# Patient Record
Sex: Female | Born: 1965
Health system: Southern US, Community
[De-identification: ages and names within clinical notes are randomized; demographics above are authoritative.]

## PROBLEM LIST (undated history)

## (undated) DIAGNOSIS — E785 Hyperlipidemia, unspecified: Secondary | ICD-10-CM

## (undated) DIAGNOSIS — E119 Type 2 diabetes mellitus without complications: Secondary | ICD-10-CM

## (undated) HISTORY — PX: CHOLECYSTECTOMY: SHX55

## (undated) HISTORY — DX: Type 2 diabetes mellitus without complications: E11.9

---

## 1998-03-23 ENCOUNTER — Inpatient Hospital Stay (HOSPITAL_COMMUNITY): Admission: AD | Admit: 1998-03-23 | Discharge: 1998-03-25 | Payer: Self-pay | Admitting: Obstetrics and Gynecology

## 1998-05-01 ENCOUNTER — Emergency Department (HOSPITAL_COMMUNITY): Admission: EM | Admit: 1998-05-01 | Discharge: 1998-05-01 | Payer: Self-pay | Admitting: Emergency Medicine

## 1998-05-02 ENCOUNTER — Encounter (HOSPITAL_COMMUNITY): Admission: RE | Admit: 1998-05-02 | Discharge: 1998-07-31 | Payer: Self-pay | Admitting: *Deleted

## 1998-05-07 ENCOUNTER — Ambulatory Visit (HOSPITAL_COMMUNITY): Admission: RE | Admit: 1998-05-07 | Discharge: 1998-05-07 | Payer: Self-pay | Admitting: *Deleted

## 1998-08-09 ENCOUNTER — Encounter (HOSPITAL_COMMUNITY): Admission: RE | Admit: 1998-08-09 | Discharge: 1998-11-07 | Payer: Self-pay | Admitting: *Deleted

## 2001-07-26 ENCOUNTER — Encounter: Payer: Self-pay | Admitting: Obstetrics

## 2001-07-26 ENCOUNTER — Inpatient Hospital Stay (HOSPITAL_COMMUNITY): Admission: AD | Admit: 2001-07-26 | Discharge: 2001-07-26 | Payer: Self-pay | Admitting: Obstetrics

## 2002-02-21 ENCOUNTER — Other Ambulatory Visit: Admission: RE | Admit: 2002-02-21 | Discharge: 2002-02-21 | Payer: Self-pay | Admitting: *Deleted

## 2002-10-03 ENCOUNTER — Ambulatory Visit (HOSPITAL_BASED_OUTPATIENT_CLINIC_OR_DEPARTMENT_OTHER): Admission: RE | Admit: 2002-10-03 | Discharge: 2002-10-03 | Payer: Self-pay | Admitting: General Surgery

## 2003-03-06 ENCOUNTER — Other Ambulatory Visit: Admission: RE | Admit: 2003-03-06 | Discharge: 2003-03-06 | Payer: Self-pay | Admitting: Obstetrics and Gynecology

## 2004-08-15 ENCOUNTER — Other Ambulatory Visit: Admission: RE | Admit: 2004-08-15 | Discharge: 2004-08-15 | Payer: Self-pay | Admitting: Obstetrics and Gynecology

## 2005-09-17 ENCOUNTER — Other Ambulatory Visit: Admission: RE | Admit: 2005-09-17 | Discharge: 2005-09-17 | Payer: Self-pay | Admitting: Obstetrics and Gynecology

## 2006-08-20 ENCOUNTER — Encounter: Admission: RE | Admit: 2006-08-20 | Discharge: 2006-08-20 | Payer: Self-pay | Admitting: Orthopedic Surgery

## 2010-11-14 ENCOUNTER — Other Ambulatory Visit: Payer: Self-pay | Admitting: Obstetrics and Gynecology

## 2012-04-13 ENCOUNTER — Encounter (HOSPITAL_COMMUNITY): Payer: Self-pay | Admitting: Emergency Medicine

## 2012-04-13 ENCOUNTER — Emergency Department (HOSPITAL_COMMUNITY): Payer: 59

## 2012-04-13 ENCOUNTER — Emergency Department (HOSPITAL_COMMUNITY)
Admission: EM | Admit: 2012-04-13 | Discharge: 2012-04-13 | Disposition: A | Discharge status: Home / Self Care | Payer: 59 | Attending: Emergency Medicine | Admitting: Emergency Medicine

## 2012-04-13 DIAGNOSIS — T50995A Adverse effect of other drugs, medicaments and biological substances, initial encounter: Secondary | ICD-10-CM | POA: Insufficient documentation

## 2012-04-13 DIAGNOSIS — T483X5A Adverse effect of antitussives, initial encounter: Secondary | ICD-10-CM | POA: Insufficient documentation

## 2012-04-13 DIAGNOSIS — R4182 Altered mental status, unspecified: Secondary | ICD-10-CM | POA: Insufficient documentation

## 2012-04-13 DIAGNOSIS — T50905A Adverse effect of unspecified drugs, medicaments and biological substances, initial encounter: Secondary | ICD-10-CM

## 2012-04-13 LAB — POCT I-STAT, CHEM 8
BUN: 14 mg/dL (ref 6–23)
Calcium, Ion: 1.25 mmol/L (ref 1.12–1.32)
Chloride: 108 mEq/L (ref 96–112)
Creatinine, Ser: 0.7 mg/dL (ref 0.50–1.10)
Glucose, Bld: 106 mg/dL — ABNORMAL HIGH (ref 70–99)
HCT: 37 % (ref 36.0–46.0)
Hemoglobin: 12.6 g/dL (ref 12.0–15.0)
Potassium: 4.1 mEq/L (ref 3.5–5.1)
Sodium: 143 mEq/L (ref 135–145)
TCO2: 26 mmol/L (ref 0–100)

## 2012-04-13 MED ORDER — BENZONATATE 100 MG PO CAPS: 100.0000 mg | ORAL_CAPSULE | Freq: Three times a day (TID) | ORAL | Status: AC

## 2012-04-13 MED ORDER — BENZONATATE 100 MG PO CAPS: 100.0000 mg | ORAL_CAPSULE | Freq: Three times a day (TID) | ORAL | Status: DC

## 2012-04-13 NOTE — ED Notes (Signed)
Charge nurse discharged pt.
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
## 2012-04-13 NOTE — ED Notes (Signed)
Patient transported to X-ray 

## 2012-04-13 NOTE — ED Notes (Signed)
Pt family states pt woke up around 2am and was talking and not making sense and confused  States they have been out of town and returned Sunday  Spouse states pt could not remember names and has been very confused  Paramedics were called but pt came in by POV  Pt is alert and oriented in triage

## 2012-04-13 NOTE — ED Notes (Signed)
Pt states she feels fine and wants to go home  Encouraged to stay  Pt was able to answer all questions appropriately in triage and provide accurate medical hx

## 2012-04-13 NOTE — ED Notes (Signed)
Patient's family member has been to the desk twice asking about discharge papers. I explained to her that the nurse will be in shortly. She came back out to the desk asking again and I explained the nurse was in another patient's room. Family member asked to speak to the nursing supervisor, Stanford Breed the [employer] Nurse notified.

## 2012-07-29 ENCOUNTER — Other Ambulatory Visit: Payer: Self-pay | Admitting: Obstetrics and Gynecology

## 2013-02-09 ENCOUNTER — Other Ambulatory Visit: Payer: Self-pay | Admitting: Obstetrics and Gynecology

## 2013-09-09 ENCOUNTER — Ambulatory Visit: Payer: 59

## 2013-09-09 ENCOUNTER — Ambulatory Visit (INDEPENDENT_AMBULATORY_CARE_PROVIDER_SITE_OTHER): Payer: 59 | Admitting: Emergency Medicine

## 2013-09-09 ENCOUNTER — Telehealth: Payer: Self-pay | Admitting: Emergency Medicine

## 2013-09-09 VITALS — BP 122/76 | HR 76 | Temp 98.9°F | Resp 18 | Ht 61.0 in | Wt 208.6 lb

## 2013-09-09 DIAGNOSIS — R05 Cough: Secondary | ICD-10-CM

## 2013-09-09 DIAGNOSIS — J189 Pneumonia, unspecified organism: Secondary | ICD-10-CM

## 2013-09-09 LAB — POCT INFLUENZA A/B
Influenza A, POC: NEGATIVE
Influenza B, POC: NEGATIVE

## 2013-09-09 LAB — POCT CBC
Hemoglobin: 13 g/dL (ref 12.2–16.2)
MCH, POC: 29.7 pg (ref 27–31.2)
MPV: 8.8 fL (ref 0–99.8)
POC MID %: 8 %M (ref 0–12)
RBC: 4.37 M/uL (ref 4.04–5.48)
WBC: 4.5 10*3/uL — AB (ref 4.6–10.2)

## 2013-09-09 MED ORDER — BENZONATATE 100 MG PO CAPS: 100.0000 mg | ORAL_CAPSULE | Freq: Three times a day (TID) | ORAL | Status: DC | PRN

## 2013-09-09 MED ORDER — HYDROCODONE-HOMATROPINE 5-1.5 MG/5ML PO SYRP: 5.0000 mL | ORAL_SOLUTION | Freq: Three times a day (TID) | ORAL | Status: DC | PRN

## 2013-09-09 MED ORDER — AZITHROMYCIN 500 MG PO TABS: 500.0000 mg | ORAL_TABLET | Freq: Every day | ORAL | Status: DC

## 2013-09-09 NOTE — Patient Instructions (Signed)

## 2013-09-09 NOTE — Telephone Encounter (Signed)
Called her to advise to rest, she indicates she is nauseated, wants phenergan sent in to pharmacy please advise.

## 2013-09-09 NOTE — Progress Notes (Addendum)
This chart was scribed for Lesle Chris, MD by Joaquin Music, ED Scribe. This patient was seen in room Room/bed 13 and the patient's care was started at 8:22 AM. Subjective:    Patient ID: [name], female    DOB: [birth year], 47 y.o.   MRN: [id number] Chief Complaint  Patient presents with  . Cough    started sunday with body aches and low grade fever, and headache  . chest congestion    states she feels rattling in her chest   HPI KIMBELLA HEISLER is a 47 y.o. female who presents to the [facility] complaining of ongoing cough and chest congestion that began 6 days ago. She states she had a low grade fever 6 days ago but denies having a recurrent fever. Pt states when she got home from church 6 days ago, she began having chest congestion and a productive cough. She states she has had episodes of nausea and emesis but suspects this was due to her productive cough. Pt denies having sputum. She states she feels her "trachea constricts". Pt states she has a controled cough when she takes Delsym but states the cough worsens when she talks. Pt denies having a sore throat currently.  Pt also complains of L sided abd pain that she states "comes and goes away". She states the pain feels like it "twiches".  Pt state she has not had a menstrual cycle for years. She states she has not had a cycle over 2 years ago. Pts OB/GYN is Dr. Milton Ferguson. She does not suspect pregnancy.  Pt denies having taken a flu shot this year.  Review of Systems  Constitutional: Positive for fever.  HENT: Positive for congestion. Negative for sore throat.   Respiratory: Positive for cough.   Gastrointestinal: Positive for nausea and vomiting.  Musculoskeletal: Positive for myalgias.   Objective:   Physical Exam CONSTITUTIONAL: Well developed/well nourished HEAD: Normocephalic/atraumatic EYES: EOMI/PERRL ENMT: Mucous membranes moist there is nasal congestion the throat is only slightly red. NECK: supple no meningeal  signs SPINE:entire spine nontender CV: S1/S2 noted, no murmurs/rubs/gallops noted LUNGS: There are rhonchi noted left anterior chest ABDOMEN: soft, nontender, no rebound or guarding GU:no cva tenderness NEURO: Pt is awake/alert, moves all extremitiesx4 EXTREMITIES: pulses normal, full ROM SKIN: warm, color normal PSYCH: no abnormalities of mood noted    Results for orders placed during the hospital encounter of 04/13/12  POCT I-STAT, CHEM 8      Result Value Range   Sodium 143  135 - 145 mEq/L   Potassium 4.1  3.5 - 5.1 mEq/L   Chloride 108  96 - 112 mEq/L   BUN 14  6 - 23 mg/dL   Creatinine, Ser 6.57  0.50 - 1.10 mg/dL   Glucose, Bld 846 (*) 70 - 99 mg/dL   Calcium, Ion 9.62  9.52 - 1.32 mmol/L   TCO2 26  0 - 100 mmol/L   Hemoglobin 12.6  12.0 - 15.0 g/dL   HCT 84.1  32.4 - 40.1 %  POCT I-STAT TROPONIN I      Result Value Range   Troponin i, poc 0.00  0.00 - 0.08 ng/mL   Comment 3           [facility] reading (PRIMARY) by  Dr.Ramal Eckhardt there is a patchy infiltrate just below the right hilum. Please comment as to whether this is in the right lower lobe or right middle lobe. Results for orders placed in visit on 09/09/13  POCT CBC  Result Value Range   WBC 4.5 (*) 4.6 - 10.2 K/uL   Lymph, poc 2.2  0.6 - 3.4   POC LYMPH PERCENT 49.5  10 - 50 %L   MID (cbc) 0.4  0 - 0.9   POC MID % 8.0  0 - 12 %M   POC Granulocyte 1.9 (*) 2 - 6.9   Granulocyte percent 42.5  37 - 80 %G   RBC 4.37  4.04 - 5.48 M/uL   Hemoglobin 13.0  12.2 - 16.2 g/dL   HCT, POC 16.1  09.6 - 47.9 %   MCV 95.7  80 - 97 fL   MCH, POC 29.7  27 - 31.2 pg   MCHC 31.1 (*) 31.8 - 35.4 g/dL   RDW, POC 04.5     Platelet Count, POC 298  142 - 424 K/uL   MPV 8.8  0 - 99.8 fL  POCT INFLUENZA A/B      Result Value Range   Influenza A, POC Negative     Influenza B, POC Negative      Triage Vitals:BP 122/76  Pulse 76  Temp(Src) 98.9 F (37.2 C) (Oral)  Resp 18  Ht 5\' 1"  (1.549 m)  Wt 208 lb 9.6 oz (94.62 kg)  BMI  39.43 kg/m2  SpO2 97% Assessment & Plan:  He definitely has an infiltrate on chest x-ray. We'll treat with Zithromax Tessalon Perles Hycodan cough syrup I personally performed the services described in this documentation, which was scribed in my presence. The recorded information has been reviewed and is accurate.

## 2013-09-09 NOTE — Telephone Encounter (Signed)
Call Lashya and let her know the radiologist agrees there is probably a pneumonia in the right middle lobe

## 2013-09-10 NOTE — Telephone Encounter (Signed)
Call patient and check on status. Be sure to return to clinic if she is getting worse. If she is nauseated we can call in Zofran 8 mg ODT one every 8 hours as needed for nausea she can have #20

## 2013-09-12 MED ORDER — ONDANSETRON 8 MG PO TBDP: 8.0000 mg | ORAL_TABLET | Freq: Three times a day (TID) | ORAL | Status: DC | PRN

## 2013-09-12 NOTE — Telephone Encounter (Signed)
Left message for her to call me back. 

## 2013-09-12 NOTE — Telephone Encounter (Signed)
Sent in zofran

## 2013-09-13 NOTE — Telephone Encounter (Signed)
Patient called back and was advised.  

## 2013-09-18 ENCOUNTER — Ambulatory Visit (INDEPENDENT_AMBULATORY_CARE_PROVIDER_SITE_OTHER): Payer: 59 | Admitting: Emergency Medicine

## 2013-09-18 ENCOUNTER — Ambulatory Visit: Payer: 59

## 2013-09-18 VITALS — BP 118/70 | HR 95 | Temp 99.1°F | Resp 16 | Ht 61.0 in | Wt 211.0 lb

## 2013-09-18 DIAGNOSIS — R079 Chest pain, unspecified: Secondary | ICD-10-CM

## 2013-09-18 DIAGNOSIS — R05 Cough: Secondary | ICD-10-CM

## 2013-09-18 DIAGNOSIS — R9431 Abnormal electrocardiogram [ECG] [EKG]: Secondary | ICD-10-CM

## 2013-09-18 NOTE — Progress Notes (Signed)
   Subjective:    Patient ID: [name], female    DOB: 1966/04/15, 47 y.o.   MRN: [id number]  HPI pt here for a f/u from last visit cough, cough has improved. Pt c/o of left side under breast discomfort 3/10. It does not hurt it just feels uncomfortable this been going on since she got sick. Radiating to back. Denies fever, nausea, or vomiting. She does not have any pain on the right. She has sharp shooting left lateral chest pain. This seems to occur beneath the left breast. She states she is having no pain at the present time    Review of Systems     Objective:   Physical Exam patient is alert and cooperative she is non-ill appearing. Her neck is supple. Chest exam is clear heart regular rate without murmurs there is tenderness along the costal sternal margin second rib and also tenderness left lateral chest wall. Abdomen is soft nontender  EKG THERE are poor anterior forces. No acute changes are seen [facility] reading (PRIMARY) by  Dr. Cleta Alberts . Chest x-ray shows improvement in the right middle lobe infiltrate no rib fractures are seen       Assessment & Plan:  Her pneumonia seems to be clearing. She does have an abnormal EKG with very poor anterior forces I spoke with Dr. Jacinto Halim . and he is going to see the patient tomorrow at 11 AM. She will take a baby aspirin one a day.

## 2013-09-18 NOTE — Patient Instructions (Signed)
You have an appointment tomorrow morning at 11 AM to see Dr. Jacinto Halim.

## 2014-04-18 ENCOUNTER — Other Ambulatory Visit: Payer: Self-pay | Admitting: Obstetrics and Gynecology

## 2014-04-20 LAB — CYTOLOGY - PAP

## 2015-09-12 DIAGNOSIS — H40013 Open angle with borderline findings, low risk, bilateral: Secondary | ICD-10-CM | POA: Insufficient documentation

## 2016-07-02 ENCOUNTER — Ambulatory Visit (INDEPENDENT_AMBULATORY_CARE_PROVIDER_SITE_OTHER): Payer: 59 | Admitting: Internal Medicine

## 2016-07-02 DIAGNOSIS — Z7189 Other specified counseling: Secondary | ICD-10-CM | POA: Diagnosis not present

## 2016-07-02 DIAGNOSIS — Z789 Other specified health status: Secondary | ICD-10-CM

## 2016-07-02 DIAGNOSIS — Z23 Encounter for immunization: Secondary | ICD-10-CM

## 2016-07-02 DIAGNOSIS — IMO0002 Reserved for concepts with insufficient information to code with codable children: Secondary | ICD-10-CM

## 2016-07-02 DIAGNOSIS — Z9189 Other specified personal risk factors, not elsewhere classified: Secondary | ICD-10-CM

## 2016-07-02 DIAGNOSIS — Z0000 Encounter for general adult medical examination without abnormal findings: Secondary | ICD-10-CM

## 2016-07-02 MED ORDER — ONDANSETRON 8 MG PO TBDP
8.0000 mg | ORAL_TABLET | Freq: Three times a day (TID) | ORAL | 0 refills | Status: DC | PRN
Start: 2016-07-02 — End: 2018-08-24

## 2016-07-02 MED ORDER — AZITHROMYCIN 500 MG PO TABS: 500.0000 mg | ORAL_TABLET | Freq: Every day | ORAL | 0 refills | Status: DC

## 2016-07-02 NOTE — Progress Notes (Signed)
  RFV: pre travel counseling for [place]  Patient ID: Jeanella Flatteryonya O Klutz, female   DOB: 10/02/1966, 50 y.o.   MRN: [id number]  HPI  50yo F who is going with her husband to [place] from 9[place]23- [place] [address] [place]    Assessment and Plan   Pre travel vac = hep a, typhoid vac, tdap  Nausea= will give zofran  Traveler's diarrhea = will give rx for azithromycin. Gave precautions

## 2016-07-04 NOTE — Addendum Note (Signed)
Addended by: Andree CossHOWELL, Breklyn Fabrizio M on: 07/04/2016 11:36 AM   Modules accepted: Orders

## 2016-12-08 ENCOUNTER — Other Ambulatory Visit: Payer: Self-pay | Admitting: Family Medicine

## 2016-12-08 DIAGNOSIS — M25562 Pain in left knee: Secondary | ICD-10-CM

## 2016-12-18 ENCOUNTER — Ambulatory Visit
Admission: RE | Admit: 2016-12-18 | Discharge: 2016-12-18 | Disposition: A | Discharge status: Home / Self Care | Payer: 59 | Source: Ambulatory Visit | Attending: Family Medicine | Admitting: Family Medicine

## 2016-12-18 DIAGNOSIS — M25562 Pain in left knee: Secondary | ICD-10-CM

## 2017-05-05 ENCOUNTER — Other Ambulatory Visit: Payer: Self-pay | Admitting: Family Medicine

## 2017-05-05 DIAGNOSIS — M25562 Pain in left knee: Secondary | ICD-10-CM

## 2017-05-15 ENCOUNTER — Ambulatory Visit
Admission: RE | Admit: 2017-05-15 | Discharge: 2017-05-15 | Disposition: A | Discharge status: Home / Self Care | Payer: 59 | Source: Ambulatory Visit | Attending: Family Medicine | Admitting: Family Medicine

## 2017-05-15 DIAGNOSIS — M25562 Pain in left knee: Secondary | ICD-10-CM

## 2017-05-18 ENCOUNTER — Other Ambulatory Visit: Payer: 59

## 2017-11-18 DIAGNOSIS — M25562 Pain in left knee: Secondary | ICD-10-CM | POA: Diagnosis not present

## 2018-01-15 IMAGING — MR MR KNEE*L* W/O CM
4 of 6 series · 21 of 40 positions shown · non-contrast
Comparison: MRI left knee dated December 18, 2016.

CLINICAL DATA: Left knee pain and swelling after a fall.

EXAM:
MRI OF THE LEFT KNEE WITHOUT CONTRAST
TECHNIQUE: Multiplanar, multisequence MR imaging of the knee was performed. No
intravenous contrast was administered.

[Series 3: pd_tse_fs_tra · axial · 4.0mm · 0.42mm/px · z∈[-48,+36]mm · 3 of 24 slices shown]
[im 5/24]
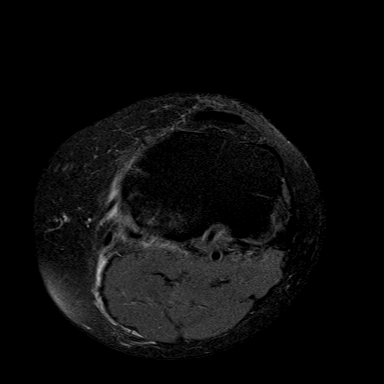
[im 14/24]
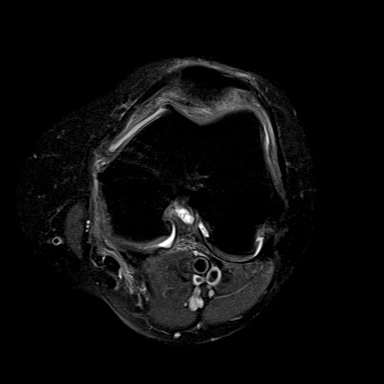
[im 24/24]
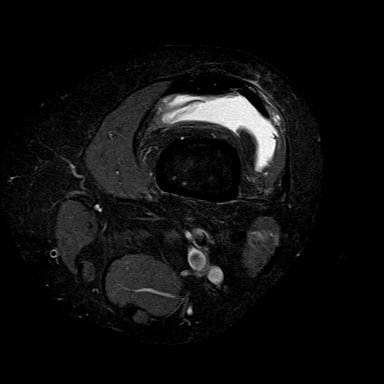

[Series 5: T2 fat-sat · coronal · 3.2mm · 0.62mm/px · 8 of 26 slices shown]
[im 1/26]
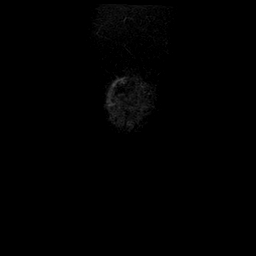
[im 4/26]
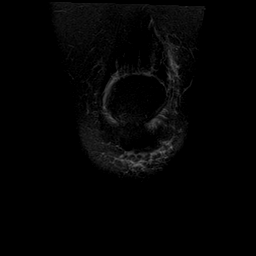
[im 8/26]
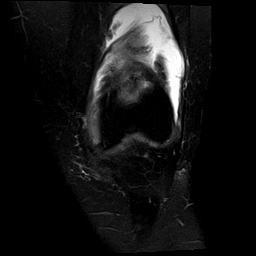
[im 11/26]
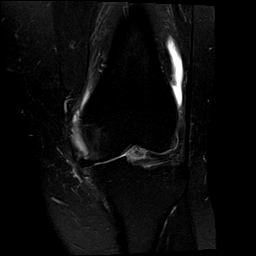
[im 15/26]
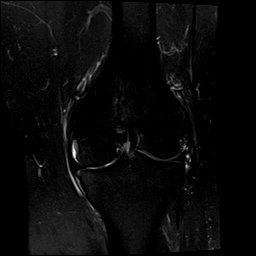
[im 18/26]
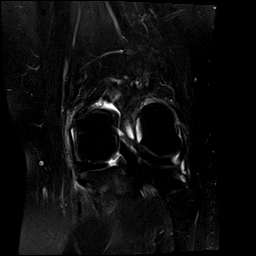
[im 22/26]
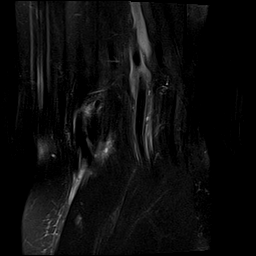
[im 26/26]
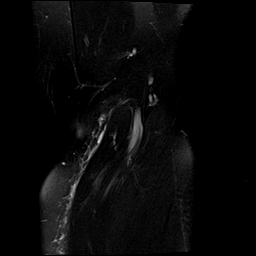

[Series 6: T1 · coronal · 3.2mm · 0.25mm/px · 3 of 26 slices shown]
[im 4/26]
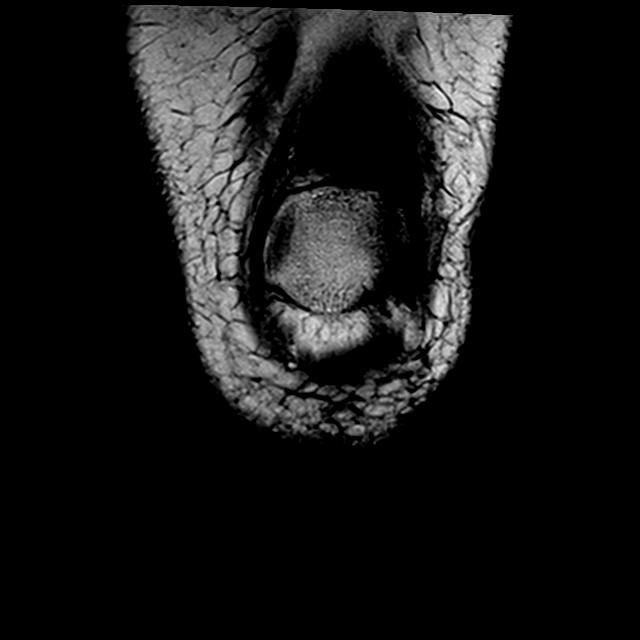
[im 15/26]
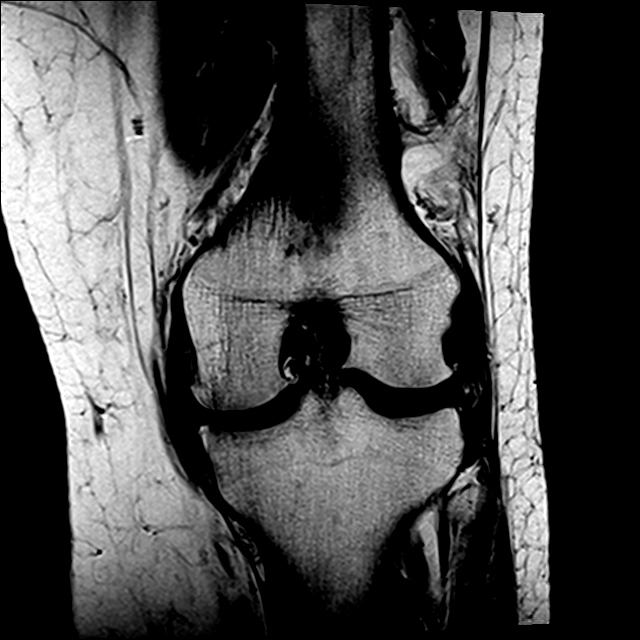
[im 22/26]
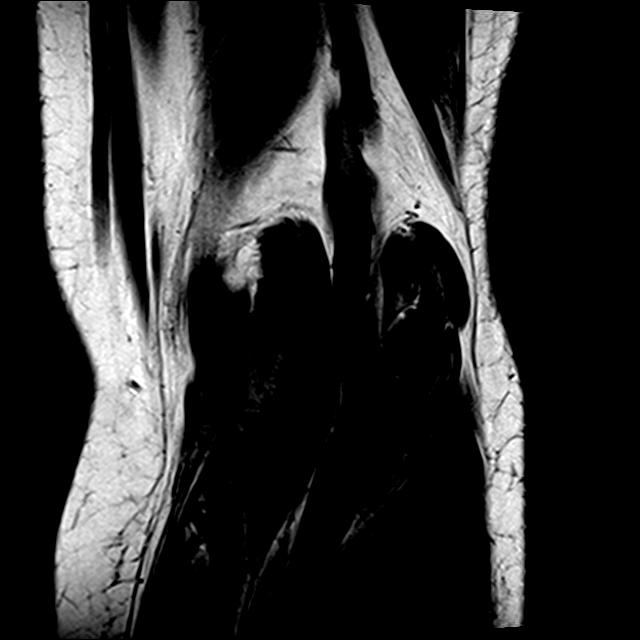

[Series 7: PD fat-sat · sagittal · 3.5mm · 0.25mm/px · 7 of 23 slices shown]
[im 1/23]
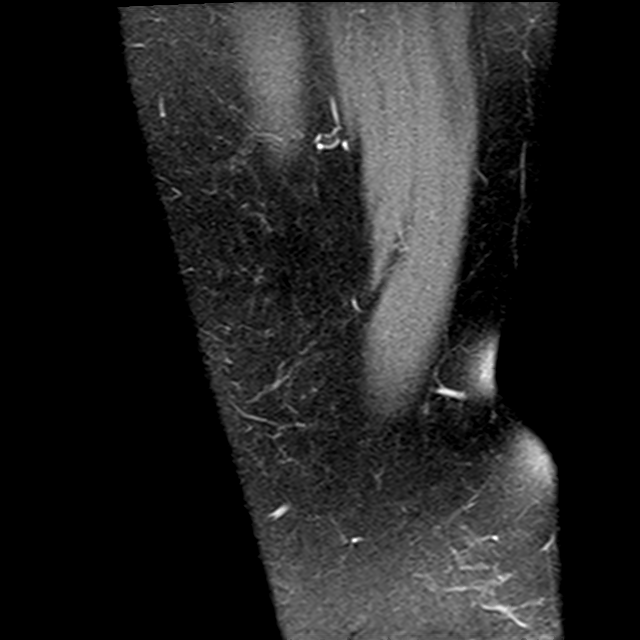
[im 4/23]
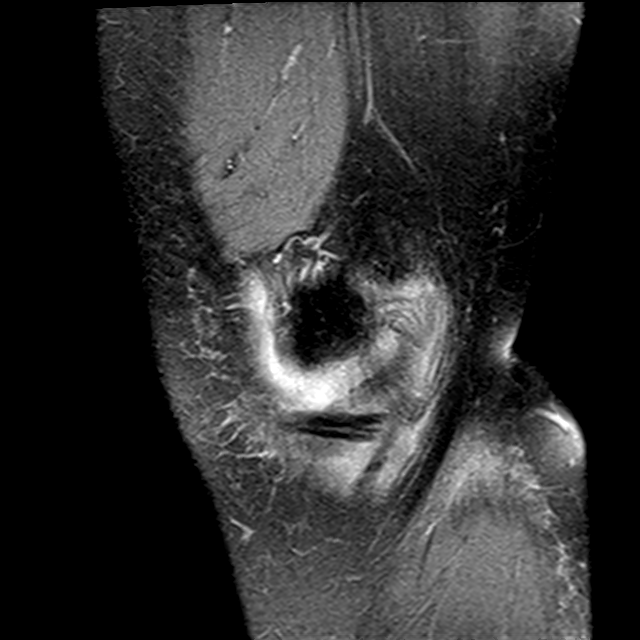
[im 8/23]
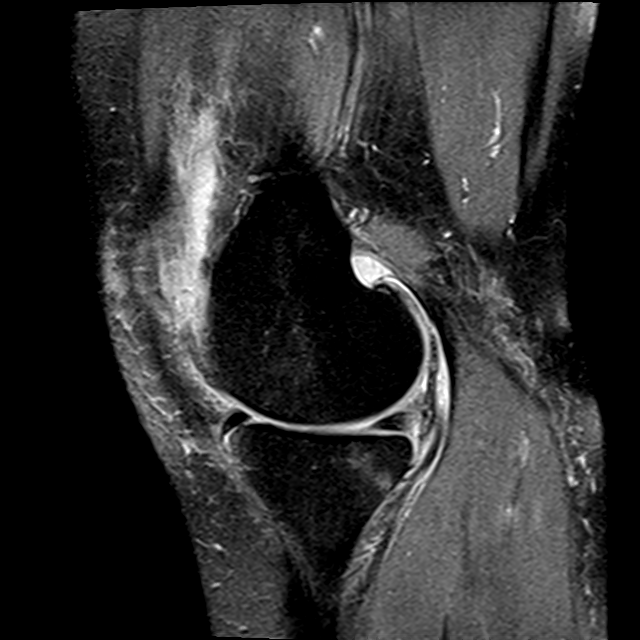
[im 12/23]
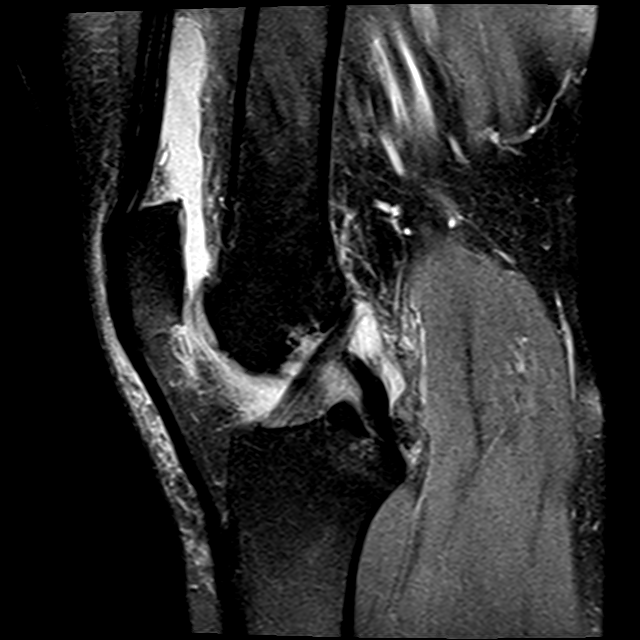
[im 15/23]
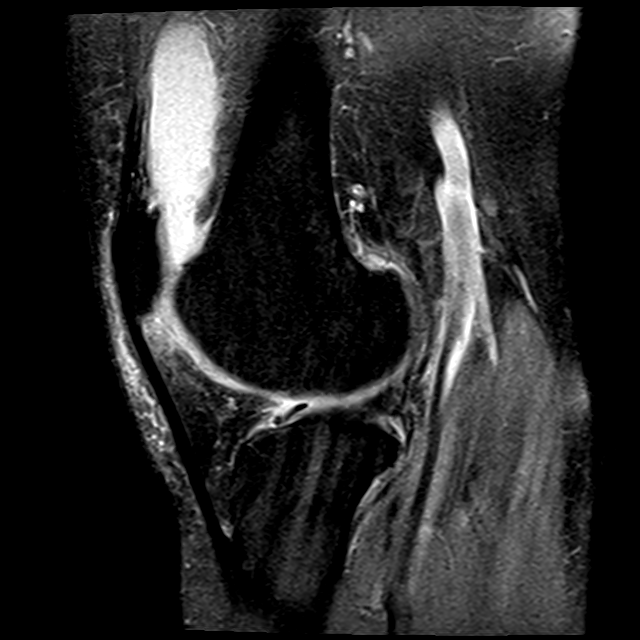
[im 19/23]
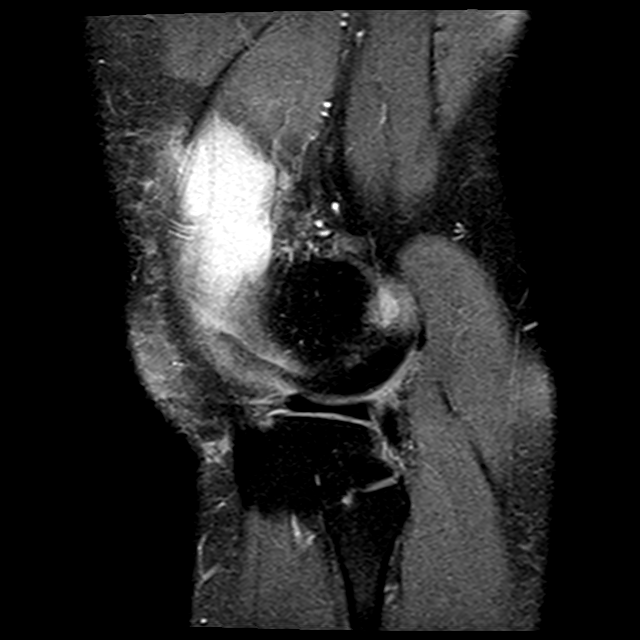
[im 23/23]
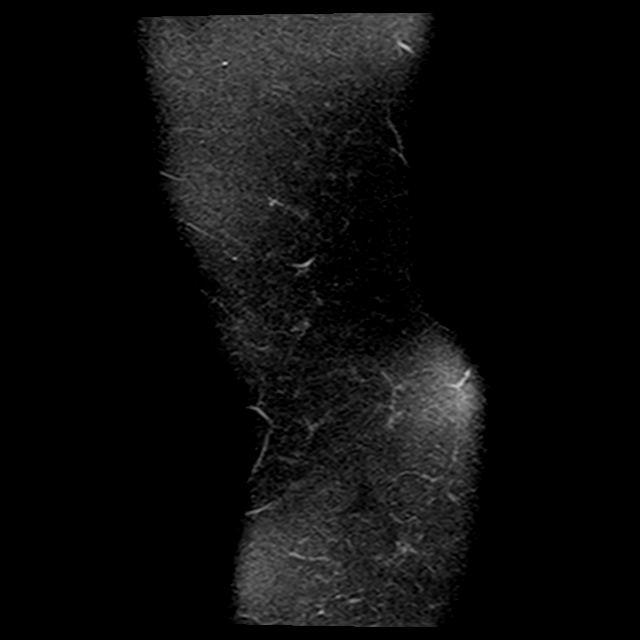

[21 of 40 positions shown; findings below may reference images not displayed]

FINDINGS: MENISCI

Medial meniscus: There is a new complete tear of the medial meniscus
posterior root resulting in extrusion of the medial meniscus body.

Lateral meniscus:  Intact.

LIGAMENTS

Cruciates:  Intact ACL and PCL.

Collaterals: Mild thickening of the proximal MCL with surrounding
edema, consistent with sprain. The lateral collateral ligament
complex is intact.

CARTILAGE

Patellofemoral: High-grade near full-thickness cartilage loss along
the medial patellar facet is unchanged. Areas of delamination and
thinning over the trochlea are also similar to prior study.

Medial: High-grade partial-thickness cartilage loss with areas of
full-thickness loss over the medial femoral condyle and medial
tibial plateau, similar to prior study.

Lateral:  Mild chondral thinning.

Joint: New moderate joint effusion. Edema in Hoffa's fat pad again
noted.

Popliteal Fossa:  Small Baker cyst.  Intact popliteus tendon.

Extensor Mechanism:  Intact quadriceps tendon and patellar tendon.

Bones: Small peripheral contusions are noted in the peripheral
medial femoral condyle and medial tibial plateau.

Other: Incidental note is made of an early origin of the anterior
tibial artery, which crosses in front of the popliteus muscle.
IMPRESSION: 1. New complete tear of the medial meniscus posterior root with
associated extrusion of the meniscal body.
2. Grade 1 sprain of the proximal MCL.
3. Tricompartmental degenerative changes and cartilage loss, worst
in the medial and patellofemoral compartments, similar to prior
study.
4. Incidental note is made of an early origin of the anterior tibial
artery, which crosses in front of the popliteus muscle.

## 2018-03-01 DIAGNOSIS — M25561 Pain in right knee: Secondary | ICD-10-CM | POA: Diagnosis not present

## 2018-03-18 ENCOUNTER — Other Ambulatory Visit: Payer: Self-pay | Admitting: Orthopaedic Surgery

## 2018-03-18 DIAGNOSIS — M25561 Pain in right knee: Principal | ICD-10-CM

## 2018-03-18 DIAGNOSIS — G8929 Other chronic pain: Secondary | ICD-10-CM

## 2018-03-19 ENCOUNTER — Ambulatory Visit
Admission: RE | Admit: 2018-03-19 | Discharge: 2018-03-19 | Disposition: A | Discharge status: Home / Self Care | Payer: 59 | Source: Ambulatory Visit | Attending: Orthopaedic Surgery | Admitting: Orthopaedic Surgery

## 2018-03-19 DIAGNOSIS — M25561 Pain in right knee: Principal | ICD-10-CM

## 2018-03-19 DIAGNOSIS — G8929 Other chronic pain: Secondary | ICD-10-CM

## 2018-03-24 DIAGNOSIS — M25561 Pain in right knee: Secondary | ICD-10-CM | POA: Diagnosis not present

## 2018-03-30 DIAGNOSIS — H40013 Open angle with borderline findings, low risk, bilateral: Secondary | ICD-10-CM | POA: Diagnosis not present

## 2018-04-01 DIAGNOSIS — G8918 Other acute postprocedural pain: Secondary | ICD-10-CM | POA: Diagnosis not present

## 2018-04-01 DIAGNOSIS — M2241 Chondromalacia patellae, right knee: Secondary | ICD-10-CM | POA: Diagnosis not present

## 2018-04-01 DIAGNOSIS — S83231A Complex tear of medial meniscus, current injury, right knee, initial encounter: Secondary | ICD-10-CM | POA: Diagnosis not present

## 2018-04-12 DIAGNOSIS — M25661 Stiffness of right knee, not elsewhere classified: Secondary | ICD-10-CM | POA: Diagnosis not present

## 2018-04-12 DIAGNOSIS — M25561 Pain in right knee: Secondary | ICD-10-CM | POA: Diagnosis not present

## 2018-04-14 DIAGNOSIS — M25561 Pain in right knee: Secondary | ICD-10-CM | POA: Diagnosis not present

## 2018-04-14 DIAGNOSIS — M25661 Stiffness of right knee, not elsewhere classified: Secondary | ICD-10-CM | POA: Diagnosis not present

## 2018-04-20 DIAGNOSIS — M25561 Pain in right knee: Secondary | ICD-10-CM | POA: Diagnosis not present

## 2018-04-20 DIAGNOSIS — M25661 Stiffness of right knee, not elsewhere classified: Secondary | ICD-10-CM | POA: Diagnosis not present

## 2018-04-23 DIAGNOSIS — M25661 Stiffness of right knee, not elsewhere classified: Secondary | ICD-10-CM | POA: Diagnosis not present

## 2018-04-23 DIAGNOSIS — M25561 Pain in right knee: Secondary | ICD-10-CM | POA: Diagnosis not present

## 2018-04-26 DIAGNOSIS — H40013 Open angle with borderline findings, low risk, bilateral: Secondary | ICD-10-CM | POA: Diagnosis not present

## 2018-07-26 DIAGNOSIS — N390 Urinary tract infection, site not specified: Secondary | ICD-10-CM | POA: Diagnosis not present

## 2018-07-26 DIAGNOSIS — Z01419 Encounter for gynecological examination (general) (routine) without abnormal findings: Secondary | ICD-10-CM | POA: Diagnosis not present

## 2018-07-28 DIAGNOSIS — Z01419 Encounter for gynecological examination (general) (routine) without abnormal findings: Secondary | ICD-10-CM | POA: Diagnosis not present

## 2018-07-28 DIAGNOSIS — Z6841 Body Mass Index (BMI) 40.0 and over, adult: Secondary | ICD-10-CM | POA: Diagnosis not present

## 2018-08-23 NOTE — Progress Notes (Signed)
Name: [name]  MRN/ DOB: [id number], 1966-07-24   Age/ Sex: 52 y.o., female    PCP: Patient, No Pcp Per   Reason for Endocrinology Evaluation: Type 2 Diabetes Mellitus     Date of Initial Endocrinology Visit: 08/24/2018     PATIENT IDENTIFIER: [name] is a 52 y.o. female with a past medical history of T2DM. The patient presented for initial ndocrinology clinic visit on 08/24/2018 for consultative assistance with her diabetes management.    HPI: [name] was referred by [facility] for management of DM    Diagnosed with DM in October with an A1c of 7.2 % Prior Medications tried: N/A Hypoglycemia episodes : 0            Hemoglobin A1c was 7.2%  Currently checking blood sugars 1 x / day,  During the day  Patient required assistance for hypoglycemia: no Patient has required hospitalization within the last 1 year from hyper or hypoglycemia: no  In terms of diet, In the past 6 months her husband hurt his hip and has been drinking regular sodas and eating poor diet.    Unable to exercise due to injury to her knees. Both parents with DM.    She is an Conservation officer, [employer].      HOME DIABETES REGIMEN: N/A  Statin: No ACE-I/ARB: No Prior Diabetic Education: Yes   METER DOWNLOAD SUMMARY: Did not bring    DIABETIC COMPLICATIONS: Microvascular complications:    Denies: neuropathy, retinopathy or nephropathy  Last eye exam: Completed 2019  Macrovascular complications:    Denies: CAD, PVD, CVA   PAST HISTORY: Past Medical History: None Past Surgical History:  Past Surgical History:  Procedure Laterality Date  . CESAREAN SECTION    . CHOLECYSTECTOMY        Social History:  reports that she has never smoked. She does not have any smokeless tobacco history on file. She reports that she does not drink alcohol or use drugs. Family History:  Family History  Problem Relation Age of Onset  . Heart disease Mother   . Hypertension Mother   . Diabetes  Father   . Hypertension Father      HOME MEDICATIONS: Allergies as of 08/24/2018   No Known Allergies     Medication List    as of 08/24/2018 11:09 AM   You have not been prescribed any medications.      ALLERGIES: No Known Allergies   REVIEW OF SYSTEMS: A comprehensive ROS was conducted with the patient and is negative except as per HPI and below:  Review of Systems  Constitutional: Negative for malaise/fatigue and weight loss.  HENT: Positive for sore throat. Negative for congestion.   Eyes: Negative for blurred vision and pain.  Respiratory: Negative for cough and shortness of breath.   Cardiovascular: Negative for chest pain and palpitations.  Gastrointestinal: Negative for diarrhea and nausea.  Genitourinary: Negative for frequency.  Musculoskeletal: Positive for joint pain.  Skin: Negative for itching and rash.  Neurological: Negative for tingling and tremors.  Endo/Heme/Allergies: Negative for polydipsia.  Psychiatric/Behavioral: Negative for depression. The patient is not nervous/anxious.       OBJECTIVE:   VITAL SIGNS: BP 132/84 (BP Location: Left Arm, Patient Position: Sitting, Cuff Size: Normal)   Pulse 80   Ht 5\' 1"  (1.549 m)   Wt 225 lb 6.4 oz (102.2 kg)   SpO2 96%   BMI 42.59 kg/m    PHYSICAL EXAM:  General: Pt appears well and  is in NAD  Hydration: Well-hydrated with moist mucous membranes and good skin turgor  HEENT: Head: Unremarkable with good dentition. Oropharynx clear without exudate.  Eyes: External eye exam normal without stare, lid lag or exophthalmos.  EOM intact.    Neck: General: Supple without adenopathy or carotid bruits. Thyroid: Thyroid size normal.  No goiter or nodules appreciated. No thyroid bruit.  Lungs: Clear with good BS bilat with no rales, rhonchi, or wheezes  Heart: RRR with normal S1 and S2 and no gallops; no murmurs; no rub  Abdomen: Normoactive bowel sounds, soft, nontender, without masses or organomegaly palpable.  Multiple stria on the abdominal wall, some of them are pale and narrow and some are wide with hyperpigmentation.  Extremities:  Lower extremities - No pretibial edema. No lesions.  Skin: Normal texture and temperature to palpation. No rash noted. Cervical  Acanthosis nigricans present .   Neuro: MS is good with appropriate affect, pt is alert and Ox3    DM foot exam:  The skin of the feet is intact without sores or ulcerations. The pedal pulses are 2+ on right and 2+ on left. The sensation is intact to a screening 5.07, 10 gram monofilament bilaterally   DATA REVIEWED: 07/29/18  A1c 7.2%  BUN/Cr. 13/ 0.64 mg/dL GFR 161 mL/min/ 0.96 m2        HDL 70 mg/dL LDL 045 mg/dL  TG 89 mg/dL  T. Chol 235 mg/dL   Old records , labs and images have been reviewed.    ASSESSMENT / PLAN / RECOMMENDATIONS:   1) Type 2 Diabetes Mellitus, Newly Diagnosed, Without complications - Most recent A1c of 7.2 %. Goal A1c < 7.0 %.     GENERAL: I have discussed with the patient the pathophysiology of diabetes. We went over the natural progression of the disease. . We stressed the importance of lifestyle changes including diet and exercise. I explained the complications associated with diabetes including retinopathy, nephropathy, neuropathy as well as increased risk of cardiovascular disease. We went over the benefit seen with glycemic control.  Patient would like to avoid starting a medication at this time. She would like to start with diet and exercise with a recheck in 3 months.  We discussed the importance of microabuminuria screen and screen for retinopathy at this time.  She was encouraged to exercise at least 150 minutes/week. Due to her joint issue, I suggested swimming exercises in an indoor pool.  We discussed the risk of CVD and neuropathy starts to  increase in the pre-diabetes period.  Discussed options for low CHO diet and avoiding sugar-sweetened  beverages.  MEDICATIONS:  N/A  EDUCATION / INSTRUCTIONS:  BG monitoring instructions: Patient is instructed to check her blood sugars 1 times a day, fasting.   2) Diabetic complications:   Eye: Does not have known diabetic retinopathy. Last eye exam was   Neuro/ Feet: Does not have known diabetic peripheral neuropathy.  Renal: Patient does not have known baseline CKD. She is not on an ACEI/ARB at present.   3) Dyslipidemia :  We discussed her elevated LDL, we discussed options of low fat diet.  Patient is not on a statin at this time . Will discuss if her LDL does not decrease to below 100 mg/dL with lifestyle changes.  I explained to her that she is at high risk for CVA and CAD events.    4) Abnormal Weight gain:   - Patient has gained ~ 40 lbs in the past 15 months, that  she attributes to poor healthy lifestyle.  - She did notice striae with weight gain but denies any weakness or easy bruising. We discussed screening for cushing but she is reluctant at this time.  - Will revisit in 3 months if no improvement in weight or glucose.    At least 45 min was spent with the patient. >50 % of the time was spent in counseling and answering questions.    F/u in 3 months.   Signed electronically by: Lyndle Herrlich, MD  [facility] Endocrinology  [facility] [address] [place], [place] [address] Phone: [phone] FAX: [phone]   CC: Patient, No Pcp Per No address on file Phone: None  Fax: None    Return to Endocrinology clinic as below: Future Appointments  Date Time Provider Department Center  11/24/2018  7:50 AM Westly Hinnant, Konrad Dolores, MD [facility] None

## 2018-08-24 ENCOUNTER — Ambulatory Visit: Payer: 59 | Admitting: Internal Medicine

## 2018-08-24 ENCOUNTER — Encounter: Payer: Self-pay | Admitting: Internal Medicine

## 2018-08-24 VITALS — BP 132/84 | HR 80 | Ht 61.0 in | Wt 225.4 lb

## 2018-08-24 DIAGNOSIS — R7309 Other abnormal glucose: Secondary | ICD-10-CM | POA: Diagnosis not present

## 2018-08-24 DIAGNOSIS — E119 Type 2 diabetes mellitus without complications: Secondary | ICD-10-CM

## 2018-08-24 DIAGNOSIS — R635 Abnormal weight gain: Secondary | ICD-10-CM | POA: Diagnosis not present

## 2018-08-24 LAB — MICROALBUMIN / CREATININE URINE RATIO
Creatinine,U: 160.9 mg/dL
Microalb Creat Ratio: 2.2 mg/g (ref 0.0–30.0)
Microalb, Ur: 3.6 mg/dL — ABNORMAL HIGH (ref 0.0–1.9)

## 2018-08-24 LAB — GLUCOSE, POCT (MANUAL RESULT ENTRY): POC GLUCOSE: 113 mg/dL — AB (ref 70–99)

## 2018-08-24 NOTE — Patient Instructions (Addendum)
-   Check sugar once a day (fasting) - Fasting sugar goal is 70-130 mg/dL - Please avoid snacking between meals and sugar-sweetened beverages - Exercise a total of 150 minutes/week

## 2018-08-25 ENCOUNTER — Encounter: Payer: Self-pay | Admitting: Internal Medicine

## 2018-08-31 ENCOUNTER — Telehealth: Payer: Self-pay | Admitting: Internal Medicine

## 2018-08-31 NOTE — Telephone Encounter (Signed)
Pt called to inquire about her blood work. She stated that she received a message on my chart with the results, but like a phone to help her understand them better.

## 2018-09-01 NOTE — Telephone Encounter (Signed)
Pt advised and stated understanding of results

## 2018-09-01 NOTE — Telephone Encounter (Signed)
Please advise 

## 2018-09-13 ENCOUNTER — Encounter: Payer: Self-pay | Admitting: Dietician

## 2018-09-13 ENCOUNTER — Encounter: Payer: 59 | Attending: Internal Medicine | Admitting: Dietician

## 2018-09-13 DIAGNOSIS — Z713 Dietary counseling and surveillance: Secondary | ICD-10-CM | POA: Diagnosis not present

## 2018-09-13 DIAGNOSIS — E119 Type 2 diabetes mellitus without complications: Secondary | ICD-10-CM

## 2018-09-13 NOTE — Progress Notes (Signed)
Diabetes Self-Management Education  Visit Type: First/Initial  Appt. Start Time: 1000 Appt. End Time: 1110  09/15/2018  [name], identified by name and date of birth, is a 52 y.o. female with a diagnosis of Diabetes: Type 2.   ASSESSMENT Patient recently diagnosed with Type 2 diabetes in 07/2018 with A1C of 7.2%.  She had been eating poorly and drinking a lot of regular soda.  Other history includes dyslipidemia with increased LDL.  Weight hx: Today's weight:  220 lbs 225 lbs at MD office 10/24/17 highest adult weight Gained 40 lbs in the past 15 months that she attributes to poor lifestyle. 185 lbs lowest adult weight in a long time. 18 months ago with good nutrition and exercise then tore her meniscus in both knees. She used to enjoy boot camp workouts before her injuries.  Patient lives with her husband.  Patient does the cooking and shopping. She is an Conservation officer, nature.  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  Husband recently injured his hip and had a hip replacement this summer.  He has gained 20 lbs since and is willing to make diet changes.  She is now walking on the treadimill and has a pool in her home she uses in the summer.  She gets 7,000-12,000 steps per day generally.  Height 5\' 2"  (1.575 m), weight 220 lb (99.8 kg). Body mass index is 40.24 kg/m.  Diabetes Self-Management Education - 09/13/18 1015      Visit Information   Visit Type  First/Initial      Initial Visit   Diabetes Type  Type 2    Are you currently following a meal plan?  Yes    What type of meal plan do you follow?  low carbohydrate, low sugar    Are you taking your medications as prescribed?  Yes    Date Diagnosed  07/2018      Health Coping   How would you rate your overall health?  Good      Psychosocial Assessment   Patient Belief/Attitude about Diabetes  Motivated to manage diabetes    Self-care barriers   None    Self-management support  Doctor's office    Other persons present  Patient    Patient Concerns  Nutrition/Meal planning;Glycemic Control;Weight Control    Special Needs  None    Preferred Learning Style  No preference indicated    Learning Readiness  Ready    How often do you need to have someone help you when you read instructions, pamphlets, or other written materials from your doctor or pharmacy?  1 - Never    What is the last grade level you completed in school?  BSN      Pre-Education Assessment   Patient understands the diabetes disease and treatment process.  Needs Instruction    Patient understands incorporating nutritional management into lifestyle.  Needs Instruction    Patient undertands incorporating physical activity into lifestyle.  Needs Instruction    Patient understands using medications safely.  Needs Instruction    Patient understands monitoring blood glucose, interpreting and using results  Needs Instruction    Patient understands prevention, detection, and treatment of acute complications.  Needs Instruction    Patient understands prevention, detection, and treatment of chronic complications.  Needs Instruction    Patient understands how to develop strategies to address psychosocial issues.  Needs Instruction    Patient understands  how to develop strategies to promote health/change behavior.  Needs Instruction      Complications   Last HgB A1C per patient/outside source  7.2 %   07/2018   How often do you check your blood sugar?  1-2 times/day    Fasting Blood glucose range (mg/dL)  16-109    Postprandial Blood glucose range (mg/dL)  604-540    Number of hypoglycemic episodes per month  0    Number of hyperglycemic episodes per week  0    Have you had a dilated eye exam in the past 12 months?  Yes    Have you had a dental exam in the past 12 months?  Yes    Are you checking your feet?  No      Dietary Intake   Breakfast  boiled eggs (2), flat bagel (1/2)  with chicken (occasional) and avocado     Snack (morning)  occasional almonds    Lunch  7 bean soup OR salmond, asparagus, salad, mashed potatoes OR salad with grilled chicken breast    Snack (afternoon)  none    Dinner  Fajitas without tortilla, 1 portion rice OR pulled pork, slaw, crab dip OR grilled chicken or pork , asparagus or other vegetable OR hotdog without a bun    Snack (evening)  rare popcorn    Beverage(s)  coffee, 2 splenda, sugar free creamer, water, sugar free orange aid, occasional coke zero or other diet soda, occasional unsweetened tea      Exercise   Exercise Type  Light (walking / raking leaves)    How many days per week to you exercise?  5    How many minutes per day do you exercise?  25    Total minutes per week of exercise  125      Patient Education   Previous Diabetes Education  No    Disease state   Definition of diabetes, type 1 and 2, and the diagnosis of diabetes;Factors that contribute to the development of diabetes;Explored patient's options for treatment of their diabetes    Nutrition management   Role of diet in the treatment of diabetes and the relationship between the three main macronutrients and blood glucose level;Food label reading, portion sizes and measuring food.;Information on hints to eating out and maintain blood glucose control.;Meal options for control of blood glucose level and chronic complications.;Effects of alcohol on blood glucose and safety factors with consumption of alcohol.    Physical activity and exercise   Role of exercise on diabetes management, blood pressure control and cardiac health.;Helped patient identify appropriate exercises in relation to his/her diabetes, diabetes complications and other health issue.    Medications  Reviewed patients medication for diabetes, action, purpose, timing of dose and side effects.    Monitoring  Purpose and frequency of SMBG.;Identified appropriate SMBG and/or A1C goals.    Acute complications   Taught treatment of hypoglycemia - the 15 rule.;Discussed and identified patients' treatment of hyperglycemia.    Chronic complications  Relationship between chronic complications and blood glucose control    Psychosocial adjustment  Role of stress on diabetes;Worked with patient to identify barriers to care and solutions;Identified and addressed patients feelings and concerns about diabetes      Individualized Goals (developed by patient)   Nutrition  General guidelines for healthy choices and portions discussed    Physical Activity  Exercise 3-5 times per week;30 minutes per day    Medications  take my medication as prescribed  Monitoring   test my blood glucose as discussed    Reducing Risk  examine blood glucose patterns      Post-Education Assessment   Patient understands the diabetes disease and treatment process.  Demonstrates understanding / competency    Patient understands incorporating nutritional management into lifestyle.  Demonstrates understanding / competency    Patient undertands incorporating physical activity into lifestyle.  Demonstrates understanding / competency    Patient understands using medications safely.  Demonstrates understanding / competency    Patient understands monitoring blood glucose, interpreting and using results  Demonstrates understanding / competency    Patient understands prevention, detection, and treatment of acute complications.  Demonstrates understanding / competency    Patient understands prevention, detection, and treatment of chronic complications.  Demonstrates understanding / competency    Patient understands how to develop strategies to address psychosocial issues.  Demonstrates understanding / competency    Patient understands how to develop strategies to promote health/change behavior.  Demonstrates understanding / competency      Outcomes   Expected Outcomes  Demonstrated interest in learning. Expect positive outcomes    Future DMSE   PRN    Program Status  Completed       Individualized Plan for Diabetes Self-Management Training:   Learning Objective:  Patient will have a greater understanding of diabetes self-management. Patient education plan is to attend individual and/or group sessions per assessed needs and concerns.   Plan:   Patient Instructions  Recommend 2000 units vitamin D daily. Mindfulness:  Choices  Eating slowly  Stopping when satisfied Continue to stay active.  Aim for 150 minutes spread out throughout the week. Continue your great nutrition changes.     Expected Outcomes:  Demonstrated interest in learning. Expect positive outcomes  Education material provided: ADA Diabetes: Your Take Control Guide, Food label handouts, Meal plan card and Snack sheet, eating out  If problems or questions, patient to contact team via:  Phone  Future DSME appointment: PRN

## 2018-09-13 NOTE — Patient Instructions (Addendum)
Recommend 2000 units vitamin D daily. Mindfulness:  Choices  Eating slowly  Stopping when satisfied Continue to stay active.  Aim for 150 minutes spread out throughout the week. Continue your great nutrition changes.

## 2018-11-23 DIAGNOSIS — H40013 Open angle with borderline findings, low risk, bilateral: Secondary | ICD-10-CM | POA: Diagnosis not present

## 2018-11-24 ENCOUNTER — Encounter: Payer: Self-pay | Admitting: Internal Medicine

## 2018-11-24 ENCOUNTER — Ambulatory Visit: Payer: 59 | Admitting: Internal Medicine

## 2018-11-24 VITALS — BP 122/84 | HR 75 | Ht 61.0 in | Wt 205.4 lb

## 2018-11-24 DIAGNOSIS — E119 Type 2 diabetes mellitus without complications: Secondary | ICD-10-CM | POA: Diagnosis not present

## 2018-11-24 LAB — POCT GLYCOSYLATED HEMOGLOBIN (HGB A1C): HEMOGLOBIN A1C: 5.8 % — AB (ref 4.0–5.6)

## 2018-11-24 LAB — GLUCOSE, POCT (MANUAL RESULT ENTRY): POC Glucose: 125 mg/dl — AB (ref 70–99)

## 2018-11-24 NOTE — Progress Notes (Signed)
Name: [name]  Age/ Sex: 53 y.o., female   MRN/ DOB: [id number], 05/02/1966     PCP: Patient, No Pcp Per   Reason for Endocrinology Evaluation: Type 2 Diabetes Mellitus  Initial Endocrine Consultative Visit: 08/24/18    PATIENT IDENTIFIER: [name] is a 53 y.o. female with a past medical history of T2DM. The patient has followed with Endocrinology clinic since 08/24/18 for consultative assistance with management of her diabetes.  DIABETIC HISTORY:  Ms. [name] was diagnosed with T2DM in October, 2019 with an A1c of 7.2% during routine Gyn appointment.Patient opted for lifestyle changes for management rather start on any anti glycemic agents.. Her hemoglobin A1c was 7.2 % in 07/2018  SUBJECTIVE:    Today (11/24/2018): Ms. [name] is here for a 3 month follow up on her diabetes management. She checks her blood sugars 1 times daily. The patient has not had hypoglycemic episodes since the last clinic visit. Otherwise, the patient has not required any recent emergency interventions for hypoglycemia and has not had recent hospitalizations secondary to hyper or hypoglycemic episodes.   In the past 3 weeks risk she has been going through some stress related to her father who has glaucoma and has worsening kidney function, her daughter who is 466 years old also has been diagnosed with diabetes and currently is on metformin, but apparently daughter is intolerant to metformin.    ROS: As per HPI and as detailed below: Review of Systems  Constitutional: Positive for weight loss. Negative for malaise/fatigue.  Respiratory: Negative for cough and shortness of breath.   Cardiovascular: Negative for chest pain and palpitations.  Gastrointestinal: Negative for diarrhea and nausea.      HOME DIABETES REGIMEN:  N/A    METER DOWNLOAD SUMMARY: Did not bring     DIABETIC COMPLICATIONS: Microvascular  complications:   Denies: neuropathy, retinopathy or nephropathy  Last eye exam: Completed 11/2018  Macrovascular complications:    Denies: CAD, PVD, CVA   HISTORY:  Past Medical History:  Past Medical History:  Diagnosis Date  . Diabetes mellitus without complication [facility](HCC)     Past Surgical History:  Past Surgical History:  Procedure Laterality Date  . CESAREAN SECTION    . CHOLECYSTECTOMY       Social History:  reports that she has never smoked. She has never used smokeless tobacco. She reports that she does not drink alcohol or use drugs. Family History:  Family History  Problem Relation Age of Onset  . Heart disease Mother   . Hypertension Mother   . Diabetes Father   . Hypertension Father       HOME MEDICATIONS: Allergies as of 11/24/2018   No Known Allergies     Medication List    as of November 24, 2018  7:34 AM   You have not been prescribed any medications.      OBJECTIVE:   Vital Signs: There were no vitals taken for this visit.  Wt Readings from Last 3 Encounters:  09/13/18 220 lb (99.8 kg)  08/24/18 225 lb 6.4 oz (102.2 kg)  09/18/13 211 lb (95.7 kg)     Exam: General: Pt appears well and is in NAD  Lungs: Clear with good BS bilat with no rales, rhonchi, or wheezes  Heart: RRR with normal S1 and S2 and no gallops; no murmurs; no rub  Abdomen: Normoactive bowel sounds, soft, nontender, without masses or organomegaly palpable  Extremities: No pretibial edema. No tremor. Normal strength and motion throughout.  See detailed diabetic foot exam below.  Skin: Normal texture and temperature to palpation.  Neuro: MS is good with appropriate affect, pt is alert and Ox3    DM foot exam:08/24/2018   The skin of the feet is intact without sores or ulcerations. The pedal pulses are 2+ on right and 2+ on left. The sensation is intact to a screening 5.07, 10 gram monofilament bilaterally         DATA REVIEWED:  No results found for:  HGBA1C Lab Results  Component Value Date   MICROALBUR 3.6 (H) 08/24/2018   CREATININE 0.70 04/13/2012   Lab Results  Component Value Date   MICRALBCREAT 2.2 08/24/2018    LDL 144 mg/dL   ASSESSMENT / PLAN / RECOMMENDATIONS:   1) Type 2 Diabetes Mellitus, Newly Diagnosed, Without complications - Most recent A1c of 5.8 %. Goal A1c < 7.0 %.    Plan:  -Placed the patient on lifestyle changes and weight loss, she has lost 20 pounds in the past 3 months, I have encouraged her to continue with lifestyle changes.  -We will continue with lifestyle changes no medications needed at this point.   EDUCATION / INSTRUCTIONS:  BG monitoring instructions: Patient is instructed to check her blood sugars 1 times a day, fasting.  Call [place] [facility] if: BG persistently < 70 or > 300. . I reviewed the Rule of 15 for the treatment of hypoglycemia in detail with the patient. Literature supplied.    2) Diabetic complications:   Eye: Does not have known diabetic retinopathy.   Neuro/ Feet: Does not have known diabetic peripheral neuropathy .   Renal: Patient does not have known baseline CKD.   3) Lipids: Patient is not on a statin.  Her last LDL was above goal at 144 mg/dL, we will check on next visit.     F/U in 3 months   Signed electronically by: Lyndle Herrlich, MD  [facility]  [facility] [address] [place], [place] [address] Phone: [phone] FAX: [phone]   CC: Patient, No Pcp Per No address on file Phone: None  Fax: None  Return to Endocrinology clinic as below: Future Appointments  Date Time Provider Department Center  11/24/2018  7:50 AM Gudelia Eugene, Konrad Dolores, MD [facility] None

## 2018-11-24 NOTE — Patient Instructions (Signed)
You have done amazing job with lifestyle changes, keep up the good work

## 2019-02-21 DIAGNOSIS — H40013 Open angle with borderline findings, low risk, bilateral: Secondary | ICD-10-CM | POA: Diagnosis not present

## 2019-03-23 NOTE — Progress Notes (Signed)
atorvat[facility]   Name: [name]  Age/ Sex: 53 y.o., female   MRN/ DOB: [id number], 10/29/1965     PCP: Patient, No Pcp Per   Reason for Endocrinology Evaluation: Type 2 Diabetes Mellitus  Initial Endocrine Consultative Visit: 08/24/18    PATIENT IDENTIFIER: [name] is a 53 y.o. female with a past medical history of T2DM. The patient has followed with Endocrinology clinic since 08/24/18 for consultative assistance with management of her diabetes.  DIABETIC HISTORY:  [name] was diagnosed with T2DM in October, 2019 with an A1c of 7.2% during routine Gyn appointment.Patient opted for lifestyle changes for management rather than start on any anti glycemic agents.. Her hemoglobin A1c was 7.2 % in 07/2018  She is an occupational nurse  SUBJECTIVE:    Previous Visit (11/24/2018): A1c was 5.8% with lifestyle changes.   Today (03/25/2019): [name] is here for a 4 month follow up on her diabetes management. She checks her blood sugars 1-2  times daily. The patient has not had hypoglycemic episodes since the last clinic visit. Otherwise, the patient has not required any recent emergency interventions for hypoglycemia and has not had recent hospitalizations secondary to hyper or hypoglycemic episodes.   She did cut her right ring finger yesterday, presented to the ED    ROS: As per HPI and as detailed below: Review of Systems  Constitutional: Negative for malaise/fatigue.  Respiratory: Negative for cough and shortness of breath.   Cardiovascular: Negative for chest pain and palpitations.  Gastrointestinal: Negative for diarrhea and nausea.      HOME DIABETES REGIMEN:  N/A    METER DOWNLOAD SUMMARY: 4/30-5/29/2020 Fingerstick Blood Glucose Tests = 5 Overall Mean FS Glucose = 122.4 Standard Deviation = 18.3  BG Ranges: Low = 110 High = 153   Hypoglycemic Events/30 Days: BG < 50 = 0 Episodes of symptomatic severe hypoglycemia = 0     DIABETIC COMPLICATIONS:  Microvascular complications:   Denies: neuropathy, retinopathy or nephropathy  Last eye exam: Completed 11/2018  Macrovascular complications:    Denies: CAD, PVD, CVA   HISTORY:  Past Medical History:  Past Medical History:  Diagnosis Date  . Diabetes mellitus without complication [facility](HCC)    Past Surgical History:  Past Surgical History:  Procedure Laterality Date  . CESAREAN SECTION    . CHOLECYSTECTOMY      Social History:  reports that she has never smoked. She has never used smokeless tobacco. She reports that she does not drink alcohol or use drugs. Family History:  Family History  Problem Relation Age of Onset  . Heart disease Mother   . Hypertension Mother   . Diabetes Father   . Hypertension Father      HOME MEDICATIONS: Allergies as of 03/24/2019   No Known Allergies     Medication List       Accurate as of March 24, 2019 11:59 PM. If you have any questions, ask your nurse or doctor.        cephALEXin 500 MG capsule Commonly known as:  KEFLEX        OBJECTIVE:   Vital Signs: BP 118/64 (BP Location: Left Arm, Patient Position: Sitting, Cuff Size: Large)   Pulse 62   Temp 97.9 F (36.6 C)   Ht 5\' 1"  (1.549 m)   Wt 206 lb (93.4 kg)   SpO2 97%   BMI 38.92 kg/m   Wt Readings from Last 3 Encounters:  03/24/19 206 lb (93.4 kg)  11/24/18  205 lb 6.4 oz (93.2 kg)  09/13/18 220 lb (99.8 kg)     Exam: General: Pt appears well and is in NAD  Lungs: Clear with good BS bilat with no rales, rhonchi, or wheezes  Heart: RRR with normal S1 and S2 and no gallops; no murmurs; no rub  Abdomen: Normoactive bowel sounds, soft, nontender, without masses or organomegaly palpable  Extremities: No pretibial edema. No tremor.  Skin: Normal texture and temperature to palpation.  Neuro: MS is good with appropriate affect, pt is alert and Ox3    DM foot exam:03/24/2019 The skin of the feet is intact without sores or ulcerations. The pedal pulses are 2+ on right  and 2+ on left. The sensation is intact to a screening 5.07, 10 gram monofilament bilaterally       DATA REVIEWED:  Lab Results  Component Value Date   HGBA1C 5.9 (A) 03/24/2019   HGBA1C 5.8 (A) 11/24/2018   Lab Results  Component Value Date   MICROALBUR 3.6 (H) 08/24/2018   LDLCALC 154 (H) 03/24/2019   CREATININE 0.69 03/24/2019   Lab Results  Component Value Date   MICRALBCREAT 2.2 08/24/2018    LDL 144 mg/dL (47/0962)   [name], [name] (MRN [id number]) as of 03/25/2019 13:05  Ref. Range 03/24/2019 08:52  Sodium Latest Ref Range: 135 - 145 mEq/L 141  Potassium Latest Ref Range: 3.5 - 5.1 mEq/L 3.9  Chloride Latest Ref Range: 96 - 112 mEq/L 106  CO2 Latest Ref Range: 19 - 32 mEq/L 24  Glucose Latest Ref Range: 70 - 99 mg/dL 546 (H)  BUN Latest Ref Range: 6 - 23 mg/dL 14  Creatinine Latest Ref Range: 0.40 - 1.20 mg/dL 5.03  Calcium Latest Ref Range: 8.4 - 10.5 mg/dL 9.6  GFR Latest Ref Range: >60.00 mL/min 107.69  Total CHOL/HDL Ratio Unknown 3  Cholesterol Latest Ref Range: 0 - 200 mg/dL 546 (H)  HDL Cholesterol Latest Ref Range: >39.00 mg/dL 56.81  LDL (calc) Latest Ref Range: 0 - 99 mg/dL 275 (H)  NonHDL Unknown 169.20  Triglycerides Latest Ref Range: 0.0 - 149.0 mg/dL 17.0  VLDL Latest Ref Range: 0.0 - 40.0 mg/dL 01.7      ASSESSMENT / PLAN / RECOMMENDATIONS:   1) Type 2 Diabetes Mellitus, excellent control, Without complications - Most recent A1c of 5.9 %. Goal A1c < 7.0 %.    Plan:  -Praised the patient on lifestyle changes, encouraged to continue with current changes and glucose checks.  - She was advised to check glucose fasting and bedtime    EDUCATION / INSTRUCTIONS:  BG monitoring instructions: Patient is instructed to check her blood sugars 1-2 times a day, fasting and bedtime.  Call [place] [facility] clinic if: BG persistently < 70 or > 300. . I reviewed the Rule of 15 for the treatment of hypoglycemia in detail with the  patient. Literature supplied.    2) Diabetic complications:   Eye: Does not have known diabetic retinopathy.   Neuro/ Feet: Does not have known diabetic peripheral neuropathy .   Renal: Patient does not have known baseline CKD.   3) Lipids: Patient is not on a statin.  Repeat  LDL continues to be high  494 mg/dL. Discussed ADA recommendation and the cardiovascular benefits for statins. Pt agreed to starting lipitor. Discussed side effects of myalgia .     F/U in 4 months   Signed electronically by: Lyndle Herrlich, MD  [place] [facility]  [facility]  [address] [place], [place] [address] Phone: [phone] FAX: [phone]   CC: Patient, No Pcp Per No address on file Phone: None  Fax: None  Return to Endocrinology clinic as below: Future Appointments  Date Time Provider Department Center  07/25/2019  8:10 AM Shamleffer, Konrad Dolores, MD [facility] None

## 2019-03-24 ENCOUNTER — Encounter: Payer: Self-pay | Admitting: Internal Medicine

## 2019-03-24 ENCOUNTER — Other Ambulatory Visit: Payer: Self-pay

## 2019-03-24 ENCOUNTER — Ambulatory Visit: Payer: 59 | Admitting: Internal Medicine

## 2019-03-24 VITALS — BP 118/64 | HR 62 | Temp 97.9°F | Ht 61.0 in | Wt 206.0 lb

## 2019-03-24 DIAGNOSIS — E119 Type 2 diabetes mellitus without complications: Secondary | ICD-10-CM | POA: Diagnosis not present

## 2019-03-24 DIAGNOSIS — E785 Hyperlipidemia, unspecified: Secondary | ICD-10-CM | POA: Diagnosis not present

## 2019-03-24 LAB — BASIC METABOLIC PANEL
BUN: 14 mg/dL (ref 6–23)
CO2: 24 mEq/L (ref 19–32)
Calcium: 9.6 mg/dL (ref 8.4–10.5)
Chloride: 106 mEq/L (ref 96–112)
Creatinine, Ser: 0.69 mg/dL (ref 0.40–1.20)
GFR: 107.69 mL/min (ref >60.00)
Glucose, Bld: 108 mg/dL — ABNORMAL HIGH (ref 70–99)
Potassium: 3.9 mEq/L (ref 3.5–5.1)
Sodium: 141 mEq/L (ref 135–145)

## 2019-03-24 LAB — POCT GLYCOSYLATED HEMOGLOBIN (HGB A1C): Hemoglobin A1C: 5.9 % — AB (ref 4.0–5.6)

## 2019-03-24 LAB — LIPID PANEL
Cholesterol: 238 mg/dL — ABNORMAL HIGH (ref 0–200)
HDL: 69.2 mg/dL (ref >39.00)
LDL Cholesterol: 154 mg/dL — ABNORMAL HIGH (ref 0–99)
NonHDL: 169.2
Total CHOL/HDL Ratio: 3
Triglycerides: 75 mg/dL (ref 0.0–149.0)
VLDL: 15 mg/dL (ref 0.0–40.0)

## 2019-03-24 NOTE — Patient Instructions (Signed)
-   Keep up the good work, your sugar readings have been optimal.

## 2019-03-25 MED ORDER — ATORVASTATIN CALCIUM 20 MG PO TABS: 20.0000 mg | ORAL_TABLET | Freq: Every day | ORAL | 3 refills | Status: DC

## 2019-07-25 ENCOUNTER — Ambulatory Visit: Payer: 59 | Admitting: Internal Medicine

## 2019-08-03 ENCOUNTER — Ambulatory Visit: Payer: 59 | Admitting: Internal Medicine

## 2019-08-17 ENCOUNTER — Telehealth: Payer: Self-pay

## 2019-08-23 ENCOUNTER — Other Ambulatory Visit: Payer: Self-pay

## 2019-08-25 ENCOUNTER — Ambulatory Visit: Payer: 59 | Admitting: Internal Medicine

## 2019-08-25 ENCOUNTER — Other Ambulatory Visit: Payer: Self-pay

## 2019-08-25 ENCOUNTER — Encounter: Payer: Self-pay | Admitting: Internal Medicine

## 2019-08-25 VITALS — BP 124/78 | HR 70 | Temp 97.9°F | Ht 61.0 in | Wt 205.0 lb

## 2019-08-25 DIAGNOSIS — E785 Hyperlipidemia, unspecified: Secondary | ICD-10-CM | POA: Diagnosis not present

## 2019-08-25 DIAGNOSIS — E119 Type 2 diabetes mellitus without complications: Secondary | ICD-10-CM

## 2019-08-25 LAB — LIPID PANEL
Cholesterol: 203 mg/dL — ABNORMAL HIGH (ref 0–200)
HDL: 65.9 mg/dL (ref >39.00)
LDL Cholesterol: 123 mg/dL — ABNORMAL HIGH (ref 0–99)
NonHDL: 137.51
Total CHOL/HDL Ratio: 3
Triglycerides: 74 mg/dL (ref 0.0–149.0)
VLDL: 14.8 mg/dL (ref 0.0–40.0)

## 2019-08-25 LAB — COMPREHENSIVE METABOLIC PANEL
ALT: 30 U/L (ref 0–35)
AST: 21 U/L (ref 0–37)
Albumin: 4.6 g/dL (ref 3.5–5.2)
Alkaline Phosphatase: 72 U/L (ref 39–117)
BUN: 17 mg/dL (ref 6–23)
CO2: 28 mEq/L (ref 19–32)
Calcium: 9.8 mg/dL (ref 8.4–10.5)
Chloride: 105 mEq/L (ref 96–112)
Creatinine, Ser: 0.66 mg/dL (ref 0.40–1.20)
GFR: 113.18 mL/min (ref >60.00)
Glucose, Bld: 102 mg/dL — ABNORMAL HIGH (ref 70–99)
Potassium: 3.8 mEq/L (ref 3.5–5.1)
Sodium: 140 mEq/L (ref 135–145)
Total Bilirubin: 0.5 mg/dL (ref 0.2–1.2)
Total Protein: 7.7 g/dL (ref 6.0–8.3)

## 2019-08-25 LAB — MICROALBUMIN / CREATININE URINE RATIO
Creatinine,U: 145.6 mg/dL
Microalb Creat Ratio: 0.5 mg/g (ref 0.0–30.0)
Microalb, Ur: 0.7 mg/dL (ref 0.0–1.9)

## 2019-08-25 LAB — POCT GLYCOSYLATED HEMOGLOBIN (HGB A1C): Hemoglobin A1C: 6 % — AB (ref 4.0–5.6)

## 2019-08-25 NOTE — Patient Instructions (Signed)
-   Keep up the good work , you continue to do GREAT - Stop by the lab today

## 2019-08-25 NOTE — Progress Notes (Signed)
Name: [name]  Age/ Sex: 53 y.[name], female   MRN/ DOB: [id number], 1965-12-05     PCP: Amanda Ishihara, MD   Reason for Endocrinology Evaluation: Type 2 Diabetes Mellitus  Initial Endocrine Consultative Visit: 08/24/18    PATIENT IDENTIFIER: [name] is a 53 y.[name] female with a past medical history of T2DM. The patient has followed with Endocrinology clinic since 08/24/18 for consultative assistance with management of her diabetes.  DIABETIC HISTORY:  [name] was diagnosed with T2DM in October, 2019 with an A1c of 7.2% during routine Gyn appointment.Patient opted for lifestyle changes for management rather than start on any anti glycemic agents.. Her hemoglobin A1c was 7.2 % in 07/2018  She is an occupational nurse  SUBJECTIVE:    Previous Visit (03/24/2019):  A1c was 5.9 % with lifestyle changes. Lipitor started   Today (08/26/2019): [name] is here for a 4 month follow up on her diabetes management. She checks her blood sugars 1-2  times daily. The patient has not had hypoglycemic episodes since the last clinic visit. Otherwise, the patient has not required any recent emergency interventions for hypoglycemia and has not had recent hospitalizations secondary to hyper or hypoglycemic episodes.   Father with glaucoma with ESRD but not on dialysis and his been busy caring for him  She has been having issues with back pain and sciatica, she also has occasional knee pains.    ROS: As per HPI and as detailed below: Review of Systems  Constitutional: Negative for malaise/fatigue.  Respiratory: Negative for cough and shortness of breath.   Cardiovascular: Negative for chest pain and palpitations.  Gastrointestinal: Negative for diarrhea and nausea.      HOME  REGIMEN:  Lipitor 20 mg daily     METER DOWNLOAD SUMMARY: 10/23-11/02/2019 Fingerstick Blood Glucose Tests = 3 Overall Mean FS Glucose = 124  BG Ranges: Low = 109 High = 131   Hypoglycemic  Events/30 Days: BG < 50 = 0 Episodes of symptomatic severe hypoglycemia = 0     DIABETIC COMPLICATIONS: Microvascular complications:   Denies: neuropathy, retinopathy or nephropathy  Last eye exam: Completed 11/2018  Macrovascular complications:    Denies: CAD, PVD, CVA   HISTORY:  Past Medical History:  Past Medical History:  Diagnosis Date  . Diabetes mellitus without complication [facility])    Past Surgical History:  Past Surgical History:  Procedure Laterality Date  . CESAREAN SECTION    . CHOLECYSTECTOMY      Social History:  reports that she has never smoked. She has never used smokeless tobacco. She reports that she does not drink alcohol or use drugs. Family History:  Family History  Problem Relation Age of Onset  . Heart disease Mother   . Hypertension Mother   . Diabetes Father   . Hypertension Father      HOME MEDICATIONS: Allergies as of 08/25/2019   No Known Allergies     Medication List       Accurate as of August 25, 2019 11:59 PM. If you have any questions, ask your nurse or doctor.        STOP taking these medications   cephALEXin 500 MG capsule Commonly known as: KEFLEX Stopped by: Scarlette Shorts, MD     TAKE these medications   atorvastatin 20 MG tablet Commonly known as: LIPITOR Take 1 tablet (20 mg total) by mouth at bedtime.        OBJECTIVE:   Vital Signs: BP 124/78 (  BP Location: Left Arm, Patient Position: Sitting, Cuff Size: Large)   Pulse 70   Temp 97.9 F (36.6 C) (Oral)   Ht 5\' 1"  (1.549 m)   Wt 205 lb (93 kg)   SpO2 98%   BMI 38.73 kg/m   Wt Readings from Last 3 Encounters:  08/25/19 205 lb (93 kg)  03/24/19 206 lb (93.4 kg)  11/24/18 205 lb 6.4 oz (93.2 kg)     Exam: General: Pt appears well and is in NAD  Lungs: Clear with good BS bilat with no rales, rhonchi, or wheezes  Heart: RRR with normal S1 and S2 and no gallops; no murmurs; no rub  Abdomen: Normoactive bowel sounds, soft, nontender,  without masses or organomegaly palpable  Extremities: No pretibial edema. No tremor.  Skin: Normal texture and temperature to palpation.  Neuro: MS is good with appropriate affect, pt is alert and Ox3    DM foot exam: 03/24/2019 The skin of the feet is intact without sores or ulcerations. The pedal pulses are 2+ on right and 2+ on left. The sensation is intact to a screening 5.07, 10 gram monofilament bilaterally       DATA REVIEWED:  Lab Results  Component Value Date   HGBA1C 6.0 (A) 08/25/2019   HGBA1C 5.9 (A) 03/24/2019   HGBA1C 5.8 (A) 11/24/2018   Lab Results  Component Value Date   MICROALBUR 0.7 08/25/2019   LDLCALC 123 (H) 08/25/2019   CREATININE 0.66 08/25/2019   Lab Results  Component Value Date   MICRALBCREAT 0.5 08/25/2019     [name], [name] (MRN [id number]) as of 08/26/2019 07:50  Ref. Range 08/25/2019 08:58  Sodium Latest Ref Range: 135 - 145 mEq/L 140  Potassium Latest Ref Range: 3.5 - 5.1 mEq/L 3.8  Chloride Latest Ref Range: 96 - 112 mEq/L 105  CO2 Latest Ref Range: 19 - 32 mEq/L 28  Glucose Latest Ref Range: 70 - 99 mg/dL 409102 (H)  BUN Latest Ref Range: 6 - 23 mg/dL 17  Creatinine Latest Ref Range: 0.40 - 1.20 mg/dL 8.110.66  Calcium Latest Ref Range: 8.4 - 10.5 mg/dL 9.8  Alkaline Phosphatase Latest Ref Range: 39 - 117 U/L 72  Albumin Latest Ref Range: 3.5 - 5.2 g/dL 4.6  AST Latest Ref Range: 0 - 37 U/L 21  ALT Latest Ref Range: 0 - 35 U/L 30  Total Protein Latest Ref Range: 6.0 - 8.3 g/dL 7.7  Total Bilirubin Latest Ref Range: 0.2 - 1.2 mg/dL 0.5  GFR Latest Ref Range: >60.00 mL/min 113.18       ASSESSMENT / PLAN / RECOMMENDATIONS:   1) Type 2 Diabetes Mellitus, excellent control, Without complications - Most recent A1c of 6.0 %. Goal A1c < 7.0 %.    Plan:  -Praised the patient on her continued efforts with lifestyle changes - We discussed that in the literature lifestyle changes has been more successful in preventing progression  to diabetes then metformin in the prediabetes group. As long as she continues with lifestyle changes, we can hold off on starting any oral glycemic agents.    EDUCATION / INSTRUCTIONS:  BG monitoring instructions: Patient is instructed to check her blood sugars 1-2 times a day, fasting and bedtime.  Call [facility] if: BG persistently < 70 or > 300. . I reviewed the Rule of 15 for the treatment of hypoglycemia in detail with the patient. Literature supplied.    2) Diabetic complications:   Eye: Does not have known  diabetic retinopathy.   Neuro/ Feet: Does not have known diabetic peripheral neuropathy .   Renal: Patient does not have known baseline CKD.   3) Dyslipidemia: Pateint is on lipitor, she has been having back and knee pain and is not sure if this is related to statins, I explained to her that statins are usually associated with myalgias as well which she doesn't seem to have, saw orthopedic and was prescribed medrol pack but is reluctant to start this, pt is questioning a trial off statins to see if this would improve her symptoms, pt could try 4 weeks without out. On the other hand if she would like to take the medrol pack, she has to limit her CHO intake while on it.  Pt also advised to start OTC vitamin D 3 1000 iu daily      F/U in 4 months   Signed electronically by: Mack Guise, MD  [facility]  [facility] [address] [place], [place] [address] Phone: [phone] FAX: [phone]   CC: Leeroy Cha, [place] [place] [address] Phone: [phone]  Fax: [phone]  Return to Endocrinology clinic as below: Future Appointments  Date Time Provider [place]  12/27/2019  7:30 AM Tyberius Ryner, Melanie Crazier, MD [facility] None

## 2019-08-25 NOTE — Telephone Encounter (Signed)
Made in error

## 2019-12-23 ENCOUNTER — Other Ambulatory Visit: Payer: Self-pay

## 2019-12-27 ENCOUNTER — Other Ambulatory Visit: Payer: Self-pay

## 2019-12-27 ENCOUNTER — Ambulatory Visit: Payer: 59 | Admitting: Internal Medicine

## 2019-12-27 ENCOUNTER — Encounter: Payer: Self-pay | Admitting: Internal Medicine

## 2019-12-27 VITALS — BP 116/78 | HR 65 | Temp 98.6°F | Ht 61.0 in | Wt 210.4 lb

## 2019-12-27 DIAGNOSIS — E785 Hyperlipidemia, unspecified: Secondary | ICD-10-CM | POA: Diagnosis not present

## 2019-12-27 DIAGNOSIS — E119 Type 2 diabetes mellitus without complications: Secondary | ICD-10-CM | POA: Diagnosis not present

## 2019-12-27 LAB — POCT GLYCOSYLATED HEMOGLOBIN (HGB A1C): Hemoglobin A1C: 6.1 % — AB (ref 4.0–5.6)

## 2019-12-27 LAB — GLUCOSE, POCT (MANUAL RESULT ENTRY): POC Glucose: 118 mg/dl — AB (ref 70–99)

## 2019-12-27 NOTE — Progress Notes (Signed)
Name: [name]  Age/ Sex: 54 y.o., female   MRN/ DOB: [id number], 1966/02/01     PCP: Lorenda Ishihara, MD   Reason for Endocrinology Evaluation: Type 2 Diabetes Mellitus  Initial Endocrine Consultative Visit: 08/24/18    [name] is a 54 y.o. female with a past medical history of T2DM. The Amanda has followed with Endocrinology clinic since 08/24/18 for consultative assistance with management of her diabetes.  DIABETIC HISTORY:  [name] was diagnosed with T2DM in October, 2019 with an A1c of 7.2% during routine Gyn appointment.Amanda opted for lifestyle changes for management rather than start on any anti glycemic agents.. Her hemoglobin A1c was 7.2 % in 07/2018  She is an occupational nurse  SUBJECTIVE:    Previous Visit (03/24/2019):  A1c was 6.0 % with lifestyle changes. Lipitor started   Today (12/27/2019): [name] is here for a 4 month follow up on her diabetes management. She checks her blood sugars occasionally. The Amanda has not had hypoglycemic episodes since the last clinic visit. Otherwise, the Amanda has not required any recent emergency interventions for hypoglycemia and has not had recent hospitalizations secondary to hyper or hypoglycemic episodes.   Father with glaucoma with ESRD , started HD ~ 11/2019. Back issues have improved, has been taking lipitor 5-6x a week     ROS: As per HPI and as detailed below: Review of Systems  Constitutional: Negative for malaise/fatigue.  Respiratory: Negative for cough and shortness of breath.   Cardiovascular: Negative for chest pain and palpitations.  Gastrointestinal: Negative for diarrhea and nausea.      HOME  REGIMEN:  Lipitor 20 mg daily     METER DOWNLOAD SUMMARY: did not bring      DIABETIC COMPLICATIONS: Microvascular complications:   Denies: neuropathy, retinopathy or nephropathy  Last eye exam: Completed 12/2018  Macrovascular complications:     Denies: CAD, PVD, CVA   HISTORY:  Past Medical History:  Past Medical History:  Diagnosis Date  . Diabetes mellitus without complication [facility])    Past Surgical History:  Past Surgical History:  Procedure Laterality Date  . CESAREAN SECTION    . CHOLECYSTECTOMY      Social History:  reports that she has never smoked. She has never used smokeless tobacco. She reports that she does not drink alcohol or use drugs. Family History:  Family History  Problem Relation Age of Onset  . Heart disease Mother   . Hypertension Mother   . Diabetes Father   . Hypertension Father      HOME MEDICATIONS: Allergies as of 12/27/2019   No Known Allergies     Medication List       Accurate as of December 27, 2019  9:46 AM. If you have any questions, ask your nurse or doctor.        atorvastatin 20 MG tablet Commonly known as: LIPITOR Take 1 tablet (20 mg total) by mouth at bedtime.        OBJECTIVE:   Vital Signs: BP 116/78 (BP Location: Left Arm, Amanda Position: Sitting, Cuff Size: Large)   Pulse 65   Temp 98.6 F (37 C)   Ht 5\' 1"  (1.549 m)   Wt 210 lb 6.4 oz (95.4 kg)   SpO2 98%   BMI 39.75 kg/m   Wt Readings from Last 3 Encounters:  12/27/19 210 lb 6.4 oz (95.4 kg)  08/25/19 205 lb (93 kg)  03/24/19 206 lb (93.4 kg)  Exam: General: Pt appears well and is in NAD  Lungs: Clear with good BS bilat with no rales, rhonchi, or wheezes  Heart: RRR with normal S1 and S2 and no gallops; no murmurs; no rub  Abdomen: Normoactive bowel sounds, soft, nontender, without masses or organomegaly palpable  Extremities: No pretibial edema. No tremor.  Neuro: MS is good with appropriate affect, pt is alert and Ox3    DM foot exam: 12/27/2019 The skin of the feet is intact without sores or ulcerations. The pedal pulses are 2+ on right and 2+ on left. The sensation is intact to a screening 5.07, 10 gram monofilament bilaterally       DATA REVIEWED:  Lab Results   Component Value Date   HGBA1C 6.1 (A) 12/27/2019   HGBA1C 6.0 (A) 08/25/2019   HGBA1C 5.9 (A) 03/24/2019   Lab Results  Component Value Date   MICROALBUR 0.7 08/25/2019   LDLCALC 123 (H) 08/25/2019   CREATININE 0.66 08/25/2019   Lab Results  Component Value Date   MICRALBCREAT 0.5 08/25/2019     [name], [name] (MRN [id number]) as of 08/26/2019 07:50  Ref. Range 08/25/2019 08:58  Sodium Latest Ref Range: 135 - 145 mEq/L 140  Potassium Latest Ref Range: 3.5 - 5.1 mEq/L 3.8  Chloride Latest Ref Range: 96 - 112 mEq/L 105  CO2 Latest Ref Range: 19 - 32 mEq/L 28  Glucose Latest Ref Range: 70 - 99 mg/dL 782 (H)  BUN Latest Ref Range: 6 - 23 mg/dL 17  Creatinine Latest Ref Range: 0.40 - 1.20 mg/dL 9.56  Calcium Latest Ref Range: 8.4 - 10.5 mg/dL 9.8  Alkaline Phosphatase Latest Ref Range: 39 - 117 U/L 72  Albumin Latest Ref Range: 3.5 - 5.2 g/dL 4.6  AST Latest Ref Range: 0 - 37 U/L 21  ALT Latest Ref Range: 0 - 35 U/L 30  Total Protein Latest Ref Range: 6.0 - 8.3 g/dL 7.7  Total Bilirubin Latest Ref Range: 0.2 - 1.2 mg/dL 0.5  GFR Latest Ref Range: >60.00 mL/min 113.18       ASSESSMENT / PLAN / RECOMMENDATIONS:   1) Type 2 Diabetes Mellitus, excellent control, Without complications - Most recent A1c of 6.1 %. Goal A1c < 7.0 %.    - She continues with great glycemic control  - She continues to be motivated to make lifestyle changes despite the circumstances.     EDUCATION / INSTRUCTIONS:  BG monitoring instructions: Amanda is instructed to check her blood sugars 2-3 times a week, fasting.  Call [place] [facility] if: BG persistently < 70 or > 300. . I reviewed the Rule of 15 for the treatment of hypoglycemia in detail with the Amanda. Literature supplied.    2) Diabetic complications:   Eye: Does not have known diabetic retinopathy.   Neuro/ Feet: Does not have known diabetic peripheral neuropathy .   Renal: Amanda does not have known  baseline CKD.   3) Dyslipidemia:  - She is intolerant to daily doses of lipitor  - She is able to tolerate it 5-6x a week which is acceptable.  - No changes will be made today      F/U in 6 months   Signed electronically by: Lyndle Herrlich, MD  [facility]  [facility] [address] [place] [place], [place] [address] Phone: [phone] FAX: [phone]   CC: Lorenda Ishihara, MD [address] [place] [address] Phone: [phone]  Fax: [phone]  Return to Endocrinology clinic as below: Future Appointments  Date Time Provider [place]  07/02/2020  7:30 AM Jasmeet Manton, Melanie Crazier, MD [facility] None

## 2019-12-27 NOTE — Patient Instructions (Signed)
-   Keep up the good work , you continue to do GREAT! ° °

## 2020-01-10 ENCOUNTER — Emergency Department (HOSPITAL_COMMUNITY)
Admission: EM | Admit: 2020-01-10 | Discharge: 2020-01-10 | Disposition: A | Discharge status: Home / Self Care | Payer: 59 | Attending: Emergency Medicine | Admitting: Emergency Medicine

## 2020-01-10 DIAGNOSIS — E119 Type 2 diabetes mellitus without complications: Secondary | ICD-10-CM | POA: Insufficient documentation

## 2020-01-10 DIAGNOSIS — Z79899 Other long term (current) drug therapy: Secondary | ICD-10-CM | POA: Diagnosis not present

## 2020-01-10 DIAGNOSIS — R55 Syncope and collapse: Secondary | ICD-10-CM

## 2020-01-10 LAB — URINALYSIS, ROUTINE W REFLEX MICROSCOPIC
Bilirubin Urine: NEGATIVE
Glucose, UA: NEGATIVE mg/dL
Hgb urine dipstick: NEGATIVE
Ketones, ur: NEGATIVE mg/dL
Nitrite: NEGATIVE
Protein, ur: NEGATIVE mg/dL
Specific Gravity, Urine: 1.019 (ref 1.005–1.030)
pH: 6 (ref 5.0–8.0)

## 2020-01-10 LAB — CBC
HCT: 41.2 % (ref 36.0–46.0)
Hemoglobin: 13.5 g/dL (ref 12.0–15.0)
MCH: 30.3 pg (ref 26.0–34.0)
MCHC: 32.8 g/dL (ref 30.0–36.0)
MCV: 92.6 fL (ref 80.0–100.0)
Platelets: 346 10*3/uL (ref 150–400)
RBC: 4.45 MIL/uL (ref 3.87–5.11)
RDW: 13.4 % (ref 11.5–15.5)
WBC: 6.2 10*3/uL (ref 4.0–10.5)
nRBC: 0 % (ref 0.0–0.2)

## 2020-01-10 LAB — BASIC METABOLIC PANEL WITH GFR
Anion gap: 12 (ref 5–15)
BUN: 11 mg/dL (ref 6–20)
CO2: 24 mmol/L (ref 22–32)
Calcium: 9.6 mg/dL (ref 8.9–10.3)
Chloride: 107 mmol/L (ref 98–111)
Creatinine, Ser: 0.82 mg/dL (ref 0.44–1.00)
GFR calc Af Amer: >60 mL/min
GFR calc non Af Amer: >60 mL/min
Glucose, Bld: 183 mg/dL — ABNORMAL HIGH (ref 70–99)
Potassium: 3.7 mmol/L (ref 3.5–5.1)
Sodium: 143 mmol/L (ref 135–145)

## 2020-01-10 LAB — CBG MONITORING, ED: Glucose-Capillary: 174 mg/dL — ABNORMAL HIGH (ref 70–99)

## 2020-01-10 MED ORDER — SODIUM CHLORIDE 0.9% FLUSH
3.0000 mL | Freq: Once | INTRAVENOUS | Status: AC
  Administered 2020-01-10: 3 mL via INTRAVENOUS

## 2020-01-10 MED ORDER — ACETAMINOPHEN 325 MG PO TABS
650.0000 mg | ORAL_TABLET | Freq: Once | ORAL | Status: AC
  Administered 2020-01-10: 19:00:00 650 mg via ORAL
  Filled 2020-01-10: qty 2

## 2020-01-10 NOTE — Discharge Instructions (Signed)
Please read and follow all provided instructions.  Your diagnoses today include:  1. Syncope, unspecified syncope type     Tests performed today include:  EKG - no concerning findings  Blood counts and electrolytes - no anemia, blood sugar 183  Urine test - no dehydration  Vital signs. See below for your results today.   Medications prescribed:   None  Take any prescribed medications only as directed.  Home care instructions:  Follow any educational materials contained in this packet.  Please rest, eat well and hydrate well at home up in the next 24 hours.  Follow-up instructions: Please follow-up with your primary care provider in the next 3 days for further evaluation of your symptoms.   Return instructions:   Please return to the Emergency Department if you experience worsening symptoms.   Return to the emergency department if you have additional episodes of lightheadedness or passing out, chest pain or shortness of breath.  Please return if you have any other emergent concerns.  Additional Information:  Your vital signs today were: BP (!) 156/80 (BP Location: Right Arm)   Pulse 61   Temp 98.1 F (36.7 C) (Oral)   Resp 16   SpO2 100%  If your blood pressure (BP) was elevated above 135/85 this visit, please have this repeated by your doctor within one month. --------------

## 2020-01-10 NOTE — ED Triage Notes (Signed)
Pt here from work, felt herself getting dizzy after lunch. Pt had witnessed syncopal episode, was lowered to ground by her coworkers-no injury/no fall. Dizziness continued after she woke up. Pt had two near-syncopal episodes with EMS. A/O x 4 on arrival. Pt endorses being under more stress than usual d/t family situations.

## 2020-01-10 NOTE — ED Provider Notes (Signed)
[facility] EMERGENCY DEPARTMENT Provider Note   CSN: [id number] Arrival date & time: 01/10/20  1416     History Chief Complaint  Patient presents with  . Loss of Consciousness    [name] is a 54 y.o. female.  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  Patient was eating lunch when she suddenly began feeling dizzy.  She describes this as a lightheaded sensation.  She put her head down on the table and had a syncopal episode.  Patient was helped to the floor by her husband and coworkers.  She came to without any seizure activity or postictal state.  EMS was called.  Patient initially did not want to come but when she got up to walk around that she had some additional episodes of lightheadedness but no full syncope.  She did not have any associated headaches or chest pain or shortness of breath at this time.  She decided to come to the hospital for evaluation after feeling lightheaded again.  She reports having elevated blood pressures on scene, improved during ambulance transport.  She denies any history of blood clots or lower extremity swelling.  Patient denies risk factors for pulmonary embolism including: unilateral leg swelling, history of DVT/PE/other blood clots, use of exogenous hormones, recent immobilizations, recent surgery, recent travel (>4hr segment), malignancy, hemoptysis.  Patient has developed a dull headache, 2-3 out of 10, behind her right eye and on her face that was not present when she syncopized.  No fevers, neck pain, confusion.  She had a small episode of vomiting during the event but no preceding vomiting or diarrhea.  He thinks that she may have been drinking slightly less than usual.  She reports being under stress due to her father starting dialysis.  She denies any sleep disturbances.  No history of similar episodes.  Recent A1c was 6.0%.  Not currently on treatments for blood  sugar.        Past Medical History:  Diagnosis Date  . Diabetes mellitus without complication [facility])     Patient Active Problem List   Diagnosis Date Noted  . Dyslipidemia 03/24/2019  . Type 2 diabetes mellitus without complication, without long-term current use of insulin (HCC) 11/24/2018  . Open angle with borderline findings and low glaucoma risk in both eyes 09/12/2015  . Nonspecific abnormal electrocardiogram (ECG) (EKG) 09/18/2013    Past Surgical History:  Procedure Laterality Date  . CESAREAN SECTION    . CHOLECYSTECTOMY       OB History   No obstetric history on file.     Family History  Problem Relation Age of Onset  . Heart disease Mother   . Hypertension Mother   . Diabetes Father   . Hypertension Father     Social History   Tobacco Use  . Smoking status: Never Smoker  . Smokeless tobacco: Never Used  Substance Use Topics  . Alcohol use: No    Comment: occassional   . Drug use: No    Home Medications Prior to Admission medications   Medication Sig Start Date End Date Taking? Authorizing Provider  atorvastatin (LIPITOR) 20 MG tablet Take 1 tablet (20 mg total) by mouth at bedtime. 03/25/19   Shamleffer, Konrad Dolores, MD    Allergies    Patient has no known allergies.  Review of Systems   Review of Systems  Constitutional: Negative for diaphoresis and fever.  HENT: Negative for congestion, dental  problem, rhinorrhea and sinus pressure.   Eyes: Negative for photophobia, discharge, redness and visual disturbance.  Respiratory: Negative for cough and shortness of breath.   Cardiovascular: Positive for chest pain. Negative for palpitations and leg swelling.  Gastrointestinal: Positive for nausea and vomiting. Negative for abdominal pain and diarrhea.  Genitourinary: Negative for dysuria.  Musculoskeletal: Negative for back pain, gait problem, neck pain and neck stiffness.  Skin: Negative for rash.  Neurological: Positive for light-headedness  and headaches. Negative for syncope, speech difficulty, weakness and numbness.  Psychiatric/Behavioral: Negative for confusion. The patient is not nervous/anxious.     Physical Exam Updated Vital Signs BP (!) 156/80 (BP Location: Right Arm)   Pulse 74   Temp 98.1 F (36.7 C) (Oral)   Resp 16   SpO2 99%   Physical Exam Vitals and nursing note reviewed.  Constitutional:      Appearance: She is well-developed. She is not diaphoretic.  HENT:     Head: Normocephalic and atraumatic.     Right Ear: Tympanic membrane, ear canal and external ear normal.     Left Ear: Tympanic membrane, ear canal and external ear normal.     Nose: Nose normal.     Mouth/Throat:     Mouth: Mucous membranes are not dry.     Pharynx: Uvula midline.  Eyes:     General: Lids are normal.        Right eye: No discharge.        Left eye: No discharge.     Extraocular Movements:     Right eye: No nystagmus.     Left eye: No nystagmus.     Conjunctiva/sclera: Conjunctivae normal.     Pupils: Pupils are equal, round, and reactive to light.  Neck:     Vascular: Normal carotid pulses. No carotid bruit or JVD.     Trachea: Trachea normal. No tracheal deviation.  Cardiovascular:     Rate and Rhythm: Normal rate and regular rhythm.     Pulses: No decreased pulses.     Heart sounds: Normal heart sounds, S1 normal and S2 normal. No murmur.  Pulmonary:     Effort: Pulmonary effort is normal. No respiratory distress.     Breath sounds: Normal breath sounds. No wheezing.  Chest:     Chest wall: No tenderness.  Abdominal:     General: Bowel sounds are normal.     Palpations: Abdomen is soft.     Tenderness: There is no abdominal tenderness. There is no guarding or rebound.  Musculoskeletal:        General: Normal range of motion.     Cervical back: Normal range of motion and neck supple. No tenderness or bony tenderness. No muscular tenderness.  Skin:    General: Skin is warm and dry.     Coloration: Skin is  not pale.  Neurological:     Mental Status: She is alert and oriented to person, place, and time.     GCS: GCS eye subscore is 4. GCS verbal subscore is 5. GCS motor subscore is 6.     Cranial Nerves: No cranial nerve deficit.     Sensory: No sensory deficit.     Coordination: Coordination normal.     Gait: Gait normal.     Deep Tendon Reflexes: Reflexes are normal and symmetric.     ED Results / Procedures / Treatments   Labs (all labs ordered are listed, but only abnormal results are displayed) Labs Reviewed  BASIC  METABOLIC PANEL - Abnormal; Notable for the following components:      Result Value   Glucose, Bld 183 (*)    All other components within normal limits  URINALYSIS, ROUTINE W REFLEX MICROSCOPIC - Abnormal; Notable for the following components:   APPearance HAZY (*)    Leukocytes,Ua LARGE (*)    Bacteria, UA RARE (*)    All other components within normal limits  CBG MONITORING, ED - Abnormal; Notable for the following components:   Glucose-Capillary 174 (*)    All other components within normal limits  CBC    ED ECG REPORT   Date: 01/10/2020  Rate: 76  Rhythm: normal sinus rhythm  QRS Axis: normal  Intervals: normal  ST/T Wave abnormalities: normal  Conduction Disutrbances:none  Narrative Interpretation: t-wave in III similar to previous  Old EKG Reviewed: unchanged from 08/2013   I have personally reviewed the EKG tracing and agree with the computerized printout as noted.   Radiology No results found.  Procedures Procedures (including critical care time)  Medications Ordered in ED Medications  sodium chloride flush (NS) 0.9 % injection 3 mL (3 mLs Intravenous Given 01/10/20 1900)  acetaminophen (TYLENOL) tablet 650 mg (650 mg Oral Given 01/10/20 1859)    ED Course  I have reviewed the triage vital signs and the nursing notes.  Pertinent labs & imaging results that were available during my care of the patient were reviewed by me and considered in  my medical decision making (see chart for details).  Patient seen and examined. Work-up initiated. EKG reviewed.  Patient looks well.  She has a normal cardiopulmonary, neurologic exam.  Will check orthostatics.  Will give Tylenol for headache.  We discussed head CT.  Patient does not really have any signs and symptoms that are consistent with a subarachnoid hemorrhage.  Her current headache developed while waiting in the waiting room.  Patient agrees to defer head imaging at this time.  Vital signs reviewed and are as follows: BP (!) 156/80 (BP Location: Right Arm)   Pulse 74   Temp 98.1 F (36.7 C) (Oral)   Resp 16   SpO2 99%   8:18 PM patient rechecked.  She states that her headache is improving with Tylenol.  She states she was mildly unsteady with standing, but feels comfortable with going home.  Husband at bedside as well.  Encouraged PCP follow-up this week for recheck.  Encouraged rest and hydration at home.  Discussed need to return if she has additional episodes of lightheadedness, passing out, chest pain, shortness of breath, severe headache or confusion.  She verbalizes understanding agrees with plan.  She seems reliable to return if worsening.     MDM Rules/Calculators/A&P                       Patient with syncopal episode today.  No associated neurologic symptoms.  No severe headache at time of event, minor headache in the ED.  Normal neuro exam.  EKG without arrhythmia, heart block, prolonged QT, signs of Brugada syndrome or reentrant tachycardia.  Patient is not on any QT prolonging medications.  Considered subarachnoid hemorrhage, allergic reaction, pulmonary embolism.  No clinical signs or symptoms of any of these and feel patient is low risk for these etiologies.  She is near baseline.  She has good family support at home and can be discharged home at this time.  Encouraged PCP follow-up for recheck.   Final Clinical Impression(s) / ED Diagnoses Final  diagnoses:  Syncope,  unspecified syncope type    Rx / DC Orders ED Discharge Orders    None       Renne Crigler, Cordelia Poche 01/10/20 2029    Geoffery Lyons, MD 01/10/20 2352

## 2020-01-12 ENCOUNTER — Telehealth: Payer: Self-pay | Admitting: *Deleted

## 2020-01-12 NOTE — Telephone Encounter (Signed)
Patient called to schedule an appointment. I informed her we would need a referral to schedule.

## 2020-01-31 ENCOUNTER — Other Ambulatory Visit: Payer: Self-pay | Admitting: Internal Medicine

## 2020-01-31 ENCOUNTER — Telehealth: Payer: Self-pay | Admitting: Internal Medicine

## 2020-01-31 DIAGNOSIS — E785 Hyperlipidemia, unspecified: Secondary | ICD-10-CM

## 2020-01-31 NOTE — Telephone Encounter (Signed)
Please advise 

## 2020-01-31 NOTE — Telephone Encounter (Signed)
Pt placed on lab schedule and informed of appt date and time. Also informed to be fasting.

## 2020-01-31 NOTE — Telephone Encounter (Signed)
Patient states that Dr Lonzo Cloud wanted her to have a Lipid panel done at her PCP to check her cholesterol. Patient is in the process of changing of PCP and wanted to know if she can come here tomorrow for labs.  Please contact patient at [phone] to advise if labs can be ordered and done here tomorrow morning

## 2020-02-01 ENCOUNTER — Other Ambulatory Visit (INDEPENDENT_AMBULATORY_CARE_PROVIDER_SITE_OTHER): Payer: 59

## 2020-02-01 ENCOUNTER — Other Ambulatory Visit: Payer: Self-pay

## 2020-02-01 DIAGNOSIS — E785 Hyperlipidemia, unspecified: Secondary | ICD-10-CM

## 2020-02-01 LAB — LIPID PANEL
Cholesterol: 185 mg/dL (ref 0–200)
HDL: 63.5 mg/dL (ref >39.00)
LDL Cholesterol: 107 mg/dL — ABNORMAL HIGH (ref 0–99)
NonHDL: 121.93
Total CHOL/HDL Ratio: 3
Triglycerides: 74 mg/dL (ref 0.0–149.0)
VLDL: 14.8 mg/dL (ref 0.0–40.0)

## 2020-02-02 ENCOUNTER — Encounter: Payer: Self-pay | Admitting: Cardiovascular Disease

## 2020-02-02 ENCOUNTER — Ambulatory Visit: Payer: 59 | Admitting: Cardiovascular Disease

## 2020-02-02 ENCOUNTER — Encounter: Payer: Self-pay | Admitting: *Deleted

## 2020-02-02 VITALS — BP 122/70 | HR 69 | Ht 61.0 in | Wt 210.0 lb

## 2020-02-02 DIAGNOSIS — R55 Syncope and collapse: Secondary | ICD-10-CM | POA: Diagnosis not present

## 2020-02-02 DIAGNOSIS — E7800 Pure hypercholesterolemia, unspecified: Secondary | ICD-10-CM

## 2020-02-02 DIAGNOSIS — E119 Type 2 diabetes mellitus without complications: Secondary | ICD-10-CM | POA: Diagnosis not present

## 2020-02-02 NOTE — Progress Notes (Signed)
Cardiology Office Note:    Date:  02/04/2020   ID:  [name], DOB 10/11/1966, MRN [id number]  PCP:  Amanda Ishihara, MD  Cardiologist:  Thurmon Fair, MD  Electrophysiologist:  None   Referring MD: [name],*   Chief Complaint  Patient presents with  . Loss of Consciousness  [name] is a 54 y.o. female who is being seen today for the evaluation of syncope at the request of [name],*.   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  She was eating spaghetti and felt suddenly dizzy, felt like she was in a fog.  She had time to tell her husband that she thought she was going to lose consciousness.  She did not have diaphoresis or flushing.   She did briefly lose consciousness. Her husband helped her down to the floor and she almost immediately woke up. Afterwards had nausea and a single episode of vomiting.  She did not have seizures or postictal confusion or sphincter tone loss.  She recovered very quickly.  She just had a mild residual headache.  In the emergency room she had a normal ECG showing normal sinus rhythm and her labs were unremarkable.  She otherwise has little in the way of cardiovascular complaints.  She has occasional brief chest discomfort at rest that is very fleeting.  She has occasional palpitations.  These are generally not sustained and she has not identified any precipitating cause of this.  She finds that she is a little unsteady on her feet and walks off towards 1 side, but she does not have true vertigo.  She has not had other focal neurological complaints.  She has been working hard on losing weight and exercising since her recent hemoglobin A1c was up to 7%.  She lost about 25 pounds and her most recent hemoglobin A1c was 6.1%.  She was not taking any hypoglycemic  medications when she had her syncopal event.  Past Medical History:  Diagnosis Date  . Diabetes mellitus without complication [facility])     Past Surgical History:  Procedure Laterality Date  . CESAREAN SECTION    . CHOLECYSTECTOMY      Current Medications: Current Meds  Medication Sig  . atorvastatin (LIPITOR) 20 MG tablet Take 1 tablet (20 mg total) by mouth at bedtime.     Allergies:   Patient has no known allergies.   Social History   Socioeconomic History  . Marital status: Married    Spouse name: Not on file  . Number of children: Not on file  . Years of education: Not on file  . Highest education level: Not on file  Occupational History  . Not on file  Tobacco Use  . Smoking status: Never Smoker  . Smokeless tobacco: Never Used  Substance and Sexual Activity  . Alcohol use: No    Comment: occassional   . Drug use: No  . Sexual activity: Not on file  Other Topics Concern  . Not on file  Social History Narrative  . Not on file   Social Determinants of Health   Financial Resource Strain:   . Difficulty of Paying Living Expenses:   Food Insecurity:   . Worried About Programme researcher, broadcasting/film/video in the Last Year:   . [employer] of Food in the Last Year:  Transportation Needs:   . Freight forwarder (Medical):   Marland Kitchen Lack of Transportation (Non-Medical):   Physical Activity:   . Days of Exercise per Week:   . Minutes of Exercise per Session:   Stress:   . Feeling of Stress :   Social Connections:   . Frequency of Communication with Friends and Family:   . Frequency of Social Gatherings with Friends and Family:   . Attends Religious Services:   . Active Member of Clubs or Organizations:   . Attends Banker Meetings:   Marland Kitchen Marital Status:      Family History: The patient's family history includes Diabetes in her father; Heart disease in her mother; Hypertension in her father and mother.  ROS:   Please see the history of present illness.     All  other systems reviewed and are negative.  EKGs/Labs/Other Studies Reviewed:    The following studies were reviewed today: ECG from 3/23 and other records from emergency room visit  EKG:  EKG is ordered today.  The ekg ordered today demonstrates normal sinus rhythm, QS pattern in leads V1-V2, unchanged from March 23  Recent Labs: 08/25/2019: ALT 30 01/10/2020: BUN 11; Creatinine, Ser 0.82; Hemoglobin 13.5; Platelets 346; Potassium 3.7; Sodium 143  Recent Lipid Panel    Component Value Date/Time   CHOL 185 02/01/2020 0924   TRIG 74.0 02/01/2020 0924   HDL 63.50 02/01/2020 0924   CHOLHDL 3 02/01/2020 0924   VLDL 14.8 02/01/2020 0924   LDLCALC 107 (H) 02/01/2020 0924    Physical Exam:    VS:  BP 122/70   Pulse 69   Ht 5\' 1"  (1.549 m)   Wt 210 lb (95.3 kg)   SpO2 99%   BMI 39.68 kg/m     Wt Readings from Last 3 Encounters:  02/02/20 210 lb (95.3 kg)  12/27/19 210 lb 6.4 oz (95.4 kg)  08/25/19 205 lb (93 kg)     GEN: Severely obese, well nourished, well developed in no acute distress HEENT: Normal NECK: No JVD; No carotid bruits LYMPHATICS: No lymphadenopathy CARDIAC: RRR, no murmurs, rubs, gallops RESPIRATORY:  Clear to auscultation without rales, wheezing or rhonchi  ABDOMEN: Soft, non-tender, non-distended MUSCULOSKELETAL:  No edema; No deformity  SKIN: Warm and dry NEUROLOGIC:  Alert and oriented x 3 PSYCHIATRIC:  Normal affect   ASSESSMENT:    1. Syncope and collapse   2. Type 2 diabetes mellitus without complication, without long-term current use of insulin (HCC)   3. Hypercholesterolemia   4. Severe obesity (BMI 35.0-39.9) with comorbidity (HCC)    PLAN:    In order of problems listed above:  1. Syncope: Her prodrome and quick recovery are both very highly suggestive of vasovagal syncope although the exact trigger is unclear.  It is also a little unusual that she has never experienced syncope before until age 47.  We will check an echocardiogram to make  sure there are no structural cardiac abnormalities.  We will check a rhythm monitor since she does have some complaints of palpitations.  Medications are not indicated at this time.  Discussed the need to stay very well-hydrated and advised a relatively liberal intake of salt in her diet.  Most importantly she should immediately pay attention to the typical prodromal symptoms that she experienced and immediately lie down if they occur. 2. DM: She is really done a great job with weight loss and has managed to control her diabetes without medications.  It does not  appear that she has been employing such strict dietary restrictions that they could have led to syncope.  Encouraged her to keep up the good job she is doing with losing weight. 3. HLP: Her weight loss has also been associated with improvement in her lipid profile.  Labs on 02/01/2020 showed total cholesterol 185, HDL 63, LDL 107 and triglycerides of 74.  Target LDL less than 100 in the absence of known vascular/coronary disease.  Medications are not indicated at this point. 4. Severe obesity: She does not have signs or symptoms that would suggest obstructive sleep apnea.  She has managed to lose quite a bit of weight.  Needs to stay well-hydrated.  Medication Adjustments/Labs and Tests Ordered: Current medicines are reviewed at length with the patient today.  Concerns regarding medicines are outlined above.  Orders Placed This Encounter  Procedures  . LONG TERM MONITOR (3-14 DAYS)  . EKG 12-Lead  . ECHOCARDIOGRAM COMPLETE   No orders of the defined types were placed in this encounter.   Patient Instructions  Medication Instructions:  No changes *If you need a refill on your cardiac medications before your next appointment, please call your pharmacy*   Lab Work: None ordered If you have labs (blood work) drawn today and your tests are completely normal, you will receive your results only by: Marland Kitchen MyChart Message (if you have MyChart)  OR . A paper copy in the mail If you have any lab test that is abnormal or we need to change your treatment, we will call you to review the results.   Testing/Procedures: Your physician has requested that you have an echocardiogram. Echocardiography is a painless test that uses sound waves to create images of your heart. It provides your doctor with information about the size and shape of your heart and how well your heart's chambers and valves are working. You may receive an ultrasound enhancing agent through an IV if needed to better visualize your heart during the echo.This procedure takes approximately one hour. There are no restrictions for this procedure. This will take place at the [address], [address].   ZIO XT- Long Term Monitor Instructions   Your physician has requested you wear your ZIO patch monitor 3 days.   This is a single patch monitor.  [employer] supplies one patch monitor per enrollment.  Additional stickers are not available.   Please do not apply patch if you will be having a Nuclear Stress Test, Echocardiogram, Cardiac CT, MRI, or Chest Xray during the time frame you would be wearing the monitor. The patch cannot be worn during these tests.  You cannot remove and re-apply the ZIO XT patch monitor.   Your ZIO patch monitor will be sent USPS Priority mail from [facility] directly to your home address. The monitor may also be mailed to a PO BOX if home delivery is not available.   It may take 3-5 days to receive your monitor after you have been enrolled.   Once you have received you monitor, please review enclosed instructions.  Your monitor has already been registered assigning a specific monitor serial # to you.   Applying the monitor   Shave hair from upper left chest.   Hold abrader disc by orange tab.  Rub abrader in 40 strokes over left upper chest as indicated in your monitor instructions.   Clean area with 4 enclosed alcohol pads .  Use all pads to  assure are is cleaned thoroughly.  Let dry.  Apply patch as indicated in monitor instructions.  Patch will be place under collarbone on left side of chest with arrow pointing upward.   Rub patch adhesive wings for 2 minutes.Remove white label marked "1".  Remove white label marked "2".  Rub patch adhesive wings for 2 additional minutes.   While looking in a mirror, press and release button in center of patch.  A small green light will flash 3-4 times .  This will be your only indicator the monitor has been turned on.     Do not shower for the first 24 hours.  You may shower after the first 24 hours.   Press button if you feel a symptom. You will hear a small click.  Record Date, Time and Symptom in the Patient Log Book.   When you are ready to remove patch, follow instructions on last 2 pages of Patient Log Book.  Stick patch monitor onto last page of Patient Log Book.   Place Patient Log Book in [place] box.  Use locking tab on box and tape box closed securely.  The [employer] has [employer] on it.  Please place in mailbox as soon as possible.  Your physician should have your test results approximately 7 days after the monitor has been mailed back to [facility].   Call [place] at [phone] if you have questions regarding your ZIO XT patch monitor.  Call them immediately if you see an orange light blinking on your monitor.   If your monitor falls off in less than 4 days contact our Monitor department at [phone].  If your monitor becomes loose or falls off after 4 days call [employer] at [phone] for suggestions on securing your monitor.    Follow-Up: At [facility], you and your health needs are our priority.  As part of our continuing mission to provide you with exceptional heart care, we have created designated Provider Care Teams.  These Care Teams include your primary Cardiologist (physician) and Advanced Practice Providers (APPs -   Physician Assistants and Nurse Practitioners) who all work together to provide you with the care you need, when you need it.  We recommend signing up for the patient portal called "MyChart".  Sign up information is provided on this After Visit Summary.  MyChart is used to connect with patients for Virtual Visits (Telemedicine).  Patients are able to view lab/test results, encounter notes, upcoming appointments, etc.  Non-urgent messages can be sent to your provider as well.   To learn more about what you can do with MyChart, go to [email or web address].    Your next appointment:   12 month(s)  The format for your next appointment:   In Person  Provider:   You may see Sanda Klein, MD or one of the following Advanced Practice Providers on your designated Care Team:    Almyra Deforest, PA-C  Fabian Sharp, [place] or   Roby Lofts, PA-C      Signed, Sanda Klein, MD  02/04/2020 6:26 PM    [place]

## 2020-02-02 NOTE — Patient Instructions (Signed)
Medication Instructions:  No changes *If you need a refill on your cardiac medications before your next appointment, please call your pharmacy*   Lab Work: None ordered If you have labs (blood work) drawn today and your tests are completely normal, you will receive your results only by: Marland Kitchen MyChart Message (if you have MyChart) OR . A paper copy in the mail If you have any lab test that is abnormal or we need to change your treatment, we will call you to review the results.   Testing/Procedures: Your physician has requested that you have an echocardiogram. Echocardiography is a painless test that uses sound waves to create images of your heart. It provides your doctor with information about the size and shape of your heart and how well your heart's chambers and valves are working. You may receive an ultrasound enhancing agent through an IV if needed to better visualize your heart during the echo.This procedure takes approximately one hour. There are no restrictions for this procedure. This will take place at the [address], [address].   ZIO XT- Long Term Monitor Instructions   Your physician has requested you wear your ZIO patch monitor 3 days.   This is a single patch monitor.  [employer] supplies one patch monitor per enrollment.  Additional stickers are not available.   Please do not apply patch if you will be having a Nuclear Stress Test, Echocardiogram, Cardiac CT, MRI, or Chest Xray during the time frame you would be wearing the monitor. The patch cannot be worn during these tests.  You cannot remove and re-apply the ZIO XT patch monitor.   Your ZIO patch monitor will be sent USPS Priority mail from [facility] directly to your home address. The monitor may also be mailed to a PO BOX if home delivery is not available.   It may take 3-5 days to receive your monitor after you have been enrolled.   Once you have received you monitor, please review enclosed instructions.  Your  monitor has already been registered assigning a specific monitor serial # to you.   Applying the monitor   Shave hair from upper left chest.   Hold abrader disc by orange tab.  Rub abrader in 40 strokes over left upper chest as indicated in your monitor instructions.   Clean area with 4 enclosed alcohol pads .  Use all pads to assure are is cleaned thoroughly.  Let dry.   Apply patch as indicated in monitor instructions.  Patch will be place under collarbone on left side of chest with arrow pointing upward.   Rub patch adhesive wings for 2 minutes.Remove white label marked "1".  Remove white label marked "2".  Rub patch adhesive wings for 2 additional minutes.   While looking in a mirror, press and release button in center of patch.  A small green light will flash 3-4 times .  This will be your only indicator the monitor has been turned on.     Do not shower for the first 24 hours.  You may shower after the first 24 hours.   Press button if you feel a symptom. You will hear a small click.  Record Date, Time and Symptom in the Patient Log Book.   When you are ready to remove patch, follow instructions on last 2 pages of Patient Log Book.  Stick patch monitor onto last page of Patient Log Book.   Place Patient Log Book in [place] box.  Use locking tab  on box and tape box closed securely.  The [employer] has [employer] on it.  Please place in mailbox as soon as possible.  Your physician should have your test results approximately 7 days after the monitor has been mailed back to [facility]   Call [facility] Customer Care at [phone] if you have questions regarding your ZIO XT patch monitor.  Call them immediately if you see an orange light blinking on your monitor.   If your monitor falls off in less than 4 days contact our Monitor department at [phone].  If your monitor becomes loose or falls off after 4 days call [employer] at [phone] for suggestions on  securing your monitor.    Follow-Up: At [facility], you and your health needs are our priority.  As part of our continuing mission to provide you with exceptional heart care, we have created designated Provider Care Teams.  These Care Teams include your primary Cardiologist (physician) and Advanced Practice Providers (APPs -  Physician Assistants and Nurse Practitioners) who all work together to provide you with the care you need, when you need it.  We recommend signing up for the patient portal called "MyChart".  Sign up information is provided on this After Visit Summary.  MyChart is used to connect with patients for Virtual Visits (Telemedicine).  Patients are able to view lab/test results, encounter notes, upcoming appointments, etc.  Non-urgent messages can be sent to your provider as well.   To learn more about what you can do with MyChart, go to [email or web address].    Your next appointment:   12 month(s)  The format for your next appointment:   In Person  Provider:   You may see Thurmon Fair, MD or one of the following Advanced Practice Providers on your designated Care Team:    Azalee Course, PA-C  Micah Flesher, PA-C or   Judy Pimple, [place]

## 2020-02-02 NOTE — Progress Notes (Signed)
Patient ID: [name], female   DOB: [birth year], 54 y.o.   MRN: [id number] Patient enrolled for [employer] to ship a 3 day ZIO XT long term holter monitor to her home.

## 2020-02-04 DIAGNOSIS — R55 Syncope and collapse: Secondary | ICD-10-CM | POA: Insufficient documentation

## 2020-02-07 ENCOUNTER — Ambulatory Visit (INDEPENDENT_AMBULATORY_CARE_PROVIDER_SITE_OTHER): Payer: 59

## 2020-02-07 DIAGNOSIS — R55 Syncope and collapse: Secondary | ICD-10-CM

## 2020-02-17 ENCOUNTER — Other Ambulatory Visit: Payer: Self-pay

## 2020-02-17 ENCOUNTER — Ambulatory Visit (HOSPITAL_COMMUNITY): Payer: 59 | Attending: Internal Medicine

## 2020-02-17 DIAGNOSIS — R55 Syncope and collapse: Secondary | ICD-10-CM | POA: Diagnosis not present

## 2020-02-20 ENCOUNTER — Telehealth: Payer: Self-pay | Admitting: Cardiovascular Disease

## 2020-02-20 NOTE — Telephone Encounter (Signed)
Pt updated with ECHO results and verbalized understanding.  

## 2020-02-20 NOTE — Telephone Encounter (Signed)
Patient calling for echo results 

## 2020-02-28 ENCOUNTER — Telehealth: Payer: Self-pay | Admitting: Cardiovascular Disease

## 2020-02-28 NOTE — Telephone Encounter (Signed)
      I went in pt's chart to see who called and if I could see her My Chart message

## 2020-06-14 ENCOUNTER — Other Ambulatory Visit: Payer: Self-pay | Admitting: Internal Medicine

## 2020-07-02 ENCOUNTER — Ambulatory Visit: Payer: 59 | Admitting: Internal Medicine

## 2020-07-02 ENCOUNTER — Encounter: Payer: Self-pay | Admitting: Internal Medicine

## 2020-07-02 ENCOUNTER — Other Ambulatory Visit: Payer: Self-pay

## 2020-07-02 VITALS — BP 142/72 | HR 68 | Ht 61.0 in | Wt 208.0 lb

## 2020-07-02 DIAGNOSIS — E785 Hyperlipidemia, unspecified: Secondary | ICD-10-CM

## 2020-07-02 DIAGNOSIS — E119 Type 2 diabetes mellitus without complications: Secondary | ICD-10-CM

## 2020-07-02 LAB — POCT GLUCOSE (DEVICE FOR HOME USE): POC Glucose: 124 mg/dl — AB (ref 70–99)

## 2020-07-02 LAB — POCT GLYCOSYLATED HEMOGLOBIN (HGB A1C): Hemoglobin A1C: 6.1 % — AB (ref 4.0–5.6)

## 2020-07-02 NOTE — Patient Instructions (Signed)
-   Keep up the good work , you continue to do GREAT!

## 2020-07-02 NOTE — Progress Notes (Signed)
Name: [name]  Age/ Sex: 54 y.o., female   MRN/ DOB: [id number], [birth year]     PCP: Tally Joe, MD   Reason for Endocrinology Evaluation: Type 2 Diabetes Mellitus  Initial Endocrine Consultative Visit: 08/24/18    PATIENT IDENTIFIER: [name] is a 54 y.o. female with a past medical history of T2DM. The patient has followed with Endocrinology clinic since 08/24/18 for consultative assistance with management of her diabetes.  DIABETIC HISTORY:  [name] was diagnosed with T2DM in October, 2019 with an A1c of 7.2% during routine Gyn appointment.Patient opted for lifestyle changes for management rather than start on any anti glycemic agents.. Her hemoglobin A1c was 7.2 % in 07/2018  She is an occupational nurse  SUBJECTIVE:    Previous Visit (03/24/2019):  A1c was 6.0 % with lifestyle changes. Lipitor started   Today (07/02/2020): [name] is here for a 4 month follow up on her diabetes management. She checks her blood sugars occasionally. The patient has not had hypoglycemic episodes since the last clinic visit.  Father with glaucoma with ESRD , started HD ~ 11/2019. Back issues have improved, has been taking lipitor 5-6x a week     Spouse with recent PE attributed to Testosterone   Had cardiac workup due to vasovagal   HOME  REGIMEN:  Lipitor 20 mg daily     METER DOWNLOAD SUMMARY: did not bring   Memory recall 120-137 mg/dL    DIABETIC COMPLICATIONS: Microvascular complications:   Denies: neuropathy, retinopathy  Last eye exam: Completed 05/2020  Macrovascular complications:    Denies: CAD, PVD, CVA   HISTORY:  Past Medical History:  Past Medical History:  Diagnosis Date  . Diabetes mellitus without complication [facility])    Past Surgical History:  Past Surgical History:  Procedure Laterality Date  . CESAREAN SECTION    . CHOLECYSTECTOMY      Social History:  reports that she has never smoked. She has never used smokeless tobacco.  She reports that she does not drink alcohol and does not use drugs. Family History:  Family History  Problem Relation Age of Onset  . Heart disease Mother   . Hypertension Mother   . Diabetes Father   . Hypertension Father      HOME MEDICATIONS: Allergies as of 07/02/2020   No Known Allergies     Medication List       Accurate as of July 02, 2020  8:03 AM. If you have any questions, ask your nurse or doctor.        atorvastatin 20 MG tablet Commonly known as: LIPITOR TAKE 1 TABLET BY MOUTH EVERYDAY AT BEDTIME        OBJECTIVE:   Vital Signs: BP (!) 142/72 (BP Location: Left Arm, Patient Position: Sitting, Cuff Size: Normal)   Pulse 68   Ht 5\' 1"  (1.549 m)   Wt 208 lb (94.3 kg)   SpO2 97%   BMI 39.30 kg/m   Wt Readings from Last 3 Encounters:  07/02/20 208 lb (94.3 kg)  02/02/20 210 lb (95.3 kg)  12/27/19 210 lb 6.4 oz (95.4 kg)     Exam: General: Pt appears well and is in NAD  Lungs: Clear with good BS bilat with no rales, rhonchi, or wheezes  Heart: RRR with normal S1 and S2 and no gallops; no murmurs; no rub  Abdomen: Normoactive bowel sounds, soft, nontender, without masses or organomegaly palpable  Extremities: No pretibial edema. No tremor.  Neuro: MS  is good with appropriate affect, pt is alert and Ox3    DM foot exam: 07/02/2020 The skin of the feet is intact without sores or ulcerations. The pedal pulses are 2+ on right and 2+ on left. The sensation is intact to a screening 5.07, 10 gram monofilament bilaterally       DATA REVIEWED:  Lab Results  Component Value Date   HGBA1C 6.1 (A) 07/02/2020   HGBA1C 6.1 (A) 12/27/2019   HGBA1C 6.0 (A) 08/25/2019   Lab Results  Component Value Date   MICROALBUR 0.7 08/25/2019   LDLCALC 107 (H) 02/01/2020   CREATININE 0.82 01/10/2020   Lab Results  Component Value Date   MICRALBCREAT 0.5 08/25/2019     [name], [name] (MRN [id number]) as of 07/02/2020 12:53  Ref. Range  01/10/2020 15:02  Sodium Latest Ref Range: 135 - 145 mmol/L 143  Potassium Latest Ref Range: 3.5 - 5.1 mmol/L 3.7  Chloride Latest Ref Range: 98 - 111 mmol/L 107  CO2 Latest Ref Range: 22 - 32 mmol/L 24  Glucose Latest Ref Range: 70 - 99 mg/dL 782 (H)  BUN Latest Ref Range: 6 - 20 mg/dL 11  Creatinine Latest Ref Range: 0.44 - 1.00 mg/dL 4.23  Calcium Latest Ref Range: 8.9 - 10.3 mg/dL 9.6  Anion gap Latest Ref Range: 5 - 15  12  GFR, Est Non African American Latest Ref Range: >60 mL/min >60  GFR, Est African American Latest Ref Range: >60 mL/min >60    [name], [name] (MRN [id number]) as of 07/02/2020 07:51  Ref. Range 08/25/2019 08:58  Creatinine,U Latest Units: mg/dL 400.8  Microalb, Ur Latest Ref Range: 0.0 - 1.9 mg/dL 0.7  MICROALB/CREAT RATIO Latest Ref Range: 0.0 - 30.0 mg/g 0.5    ASSESSMENT / PLAN / RECOMMENDATIONS:   1) Type 2 Diabetes Mellitus, excellent control, Without complications - Most recent A1c of 6.1 %. Goal A1c < 7.0 %.    - She continues with great glycemic control without any glycemic agents   EDUCATION / INSTRUCTIONS:  BG monitoring instructions: Patient is instructed to check her blood sugars 2-3 times a week, fasting.  Call [facility] if: BG persistently < 70  . I reviewed the Rule of 15 for the treatment of hypoglycemia in detail with the patient. Literature supplied.    2) Diabetic complications:   Eye: Does not have known diabetic retinopathy.   Neuro/ Feet: Does not have known diabetic peripheral neuropathy .   Renal: Patient does not have known baseline CKD.   3) Dyslipidemia:  - She has been tolerating daily atorvastatin without side effects - LD trending down  - NO changes   Medications Atorvastatin 20 mg daily        F/U in 6 months   Signed electronically by: Lyndle Herrlich, MD  [facility] Endocrinology  [facility] [address] [place], [place]  [address] Phone: [phone] FAX: [phone]   CC: Tally Joe, MD [address] [place] [address] Phone: [phone]  Fax: [phone]  Return to Endocrinology clinic as below: No future appointments.

## 2020-07-16 ENCOUNTER — Other Ambulatory Visit: Payer: Self-pay

## 2020-07-16 ENCOUNTER — Ambulatory Visit (HOSPITAL_COMMUNITY)
Admission: RE | Admit: 2020-07-16 | Discharge: 2020-07-16 | Disposition: A | Discharge status: Home / Self Care | Payer: 59 | Source: Ambulatory Visit | Attending: Family Medicine | Admitting: Family Medicine

## 2020-07-16 ENCOUNTER — Other Ambulatory Visit (HOSPITAL_COMMUNITY): Payer: Self-pay | Admitting: Family Medicine

## 2020-07-16 DIAGNOSIS — M79604 Pain in right leg: Secondary | ICD-10-CM

## 2020-09-14 ENCOUNTER — Other Ambulatory Visit: Payer: Self-pay | Admitting: Internal Medicine

## 2021-01-04 ENCOUNTER — Ambulatory Visit: Payer: 59 | Admitting: Internal Medicine

## 2021-01-11 ENCOUNTER — Ambulatory Visit: Payer: 59 | Admitting: Internal Medicine

## 2021-01-11 ENCOUNTER — Encounter: Payer: Self-pay | Admitting: Internal Medicine

## 2021-01-11 ENCOUNTER — Other Ambulatory Visit (INDEPENDENT_AMBULATORY_CARE_PROVIDER_SITE_OTHER): Payer: 59

## 2021-01-11 ENCOUNTER — Other Ambulatory Visit: Payer: Self-pay

## 2021-01-11 VITALS — BP 117/78 | HR 77 | Resp 18 | Ht 61.0 in | Wt 212.4 lb

## 2021-01-11 DIAGNOSIS — E119 Type 2 diabetes mellitus without complications: Secondary | ICD-10-CM

## 2021-01-11 DIAGNOSIS — E785 Hyperlipidemia, unspecified: Secondary | ICD-10-CM | POA: Diagnosis not present

## 2021-01-11 LAB — COMPREHENSIVE METABOLIC PANEL
ALT: 36 U/L — ABNORMAL HIGH (ref 0–35)
AST: 24 U/L (ref 0–37)
Albumin: 4.7 g/dL (ref 3.5–5.2)
Alkaline Phosphatase: 67 U/L (ref 39–117)
BUN: 13 mg/dL (ref 6–23)
CO2: 27 mEq/L (ref 19–32)
Calcium: 9.8 mg/dL (ref 8.4–10.5)
Chloride: 106 mEq/L (ref 96–112)
Creatinine, Ser: 0.78 mg/dL (ref 0.40–1.20)
GFR: 85.88 mL/min (ref >60.00)
Glucose, Bld: 112 mg/dL — ABNORMAL HIGH (ref 70–99)
Potassium: 3.9 mEq/L (ref 3.5–5.1)
Sodium: 142 mEq/L (ref 135–145)
Total Bilirubin: 0.5 mg/dL (ref 0.2–1.2)
Total Protein: 7.5 g/dL (ref 6.0–8.3)

## 2021-01-11 LAB — LIPID PANEL
Cholesterol: 177 mg/dL (ref 0–200)
HDL: 62.4 mg/dL (ref >39.00)
LDL Cholesterol: 98 mg/dL (ref 0–99)
NonHDL: 114.41
Total CHOL/HDL Ratio: 3
Triglycerides: 80 mg/dL (ref 0.0–149.0)
VLDL: 16 mg/dL (ref 0.0–40.0)

## 2021-01-11 LAB — POCT GLYCOSYLATED HEMOGLOBIN (HGB A1C): Hemoglobin A1C: 6.3 % — AB (ref 4.0–5.6)

## 2021-01-11 LAB — MICROALBUMIN / CREATININE URINE RATIO
Creatinine,U: 207.9 mg/dL
Microalb Creat Ratio: 0.6 mg/g (ref 0.0–30.0)
Microalb, Ur: 1.2 mg/dL (ref 0.0–1.9)

## 2021-01-11 LAB — TSH: TSH: 1.37 u[IU]/mL (ref 0.35–4.50)

## 2021-01-11 LAB — T4, FREE: Free T4: 0.97 ng/dL (ref 0.60–1.60)

## 2021-01-11 LAB — GLUCOSE, POCT (MANUAL RESULT ENTRY): POC Glucose: 94 mg/dl (ref 70–99)

## 2021-01-11 NOTE — Progress Notes (Signed)
Name: [name]  Age/ Sex: 55 y.o., female   MRN/ DOB: [id number], [birth year]     PCP: Tally Joe, MD   Reason for Endocrinology Evaluation: Type 2 Diabetes Mellitus  Initial Endocrine Consultative Visit: 08/24/18    PATIENT IDENTIFIER: [name] is a 55 y.o. female with a past medical history of T2DM. The patient has followed with Endocrinology clinic since 08/24/18 for consultative assistance with management of her diabetes.  DIABETIC HISTORY:  [name] was diagnosed with T2DM in October, 2019 with an A1c of 7.2% during routine Gyn appointment.Patient opted for lifestyle changes for management rather than start on any anti glycemic agents.. Her hemoglobin A1c was 7.2 % in 07/2018    Atorvastatin started in 12/2019  She is an occupational nurse  SUBJECTIVE:    Previous Visit (07/02/2020):  A1c was 6.1 % with lifestyle changes. Lipitor started      Today (01/11/2021): [name] is here for a 4 month follow up on her diabetes management. She checks her blood sugars occasionally. The patient has not had hypoglycemic episodes since the last clinic visit.  Father with glaucoma with ESRD , started HD ~ 11/2019.   She is not happy about her  weight gain  Back issues resolved   Spouse with recent PE  Denies constipation or diarrhea but has loose stools   HOME  REGIMEN:  Lipitor 20 mg daily taking it 5x a week     METER DOWNLOAD SUMMARY: did not bring   Memory recall 120-137 mg/dL    DIABETIC COMPLICATIONS: Microvascular complications:   Denies: neuropathy, retinopathy  Last eye exam: Completed 05/2020  Macrovascular complications:    Denies: CAD, PVD, CVA   HISTORY:  Past Medical History:  Past Medical History:  Diagnosis Date  . Diabetes mellitus without complication [facility])    Past Surgical History:  Past Surgical History:  Procedure Laterality Date  . CESAREAN SECTION    . CHOLECYSTECTOMY      Social History:  reports that she has  never smoked. She has never used smokeless tobacco. She reports that she does not drink alcohol and does not use drugs. Family History:  Family History  Problem Relation Age of Onset  . Heart disease Mother   . Hypertension Mother   . Diabetes Father   . Hypertension Father      HOME MEDICATIONS: Allergies as of 01/11/2021   No Known Allergies     Medication List       Accurate as of January 11, 2021  7:53 AM. If you have any questions, ask your nurse or doctor.        atorvastatin 20 MG tablet Commonly known as: LIPITOR TAKE 1 TABLET BY MOUTH EVERYDAY AT BEDTIME        OBJECTIVE:   Vital Signs: BP 117/78 (BP Location: Left Arm, Patient Position: Sitting, Cuff Size: Large)   Pulse 77   Resp 18   Ht 5\' 1"  (1.549 m)   Wt 212 lb 6.4 oz (96.3 kg)   SpO2 97%   BMI 40.13 kg/m   Wt Readings from Last 3 Encounters:  01/11/21 212 lb 6.4 oz (96.3 kg)  07/02/20 208 lb (94.3 kg)  02/02/20 210 lb (95.3 kg)     Exam: General: Pt appears well and is in NAD  Lungs: Clear with good BS bilat with no rales, rhonchi, or wheezes  Heart: RRR with normal S1 and S2 and no gallops; no murmurs; no rub  Abdomen:  Normoactive bowel sounds, soft, nontender, without masses or organomegaly palpable  Extremities: No pretibial edema. No tremor.  Neuro: MS is good with appropriate affect, pt is alert and Ox3    DM foot exam: 01/11/2021 The skin of the feet is intact without sores or ulcerations. The pedal pulses are 2+ on right and 2+ on left. The sensation is intact to a screening 5.07, 10 gram monofilament bilaterally       DATA REVIEWED:  Lab Results  Component Value Date   HGBA1C 6.3 (A) 01/11/2021   HGBA1C 6.1 (A) 07/02/2020   HGBA1C 6.1 (A) 12/27/2019      [name], [name] (MRN [id number]) as of 01/13/2021 21:06  Ref. Range 01/11/2021 08:16  Sodium Latest Ref Range: 135 - 145 mEq/L 142  Potassium Latest Ref Range: 3.5 - 5.1 mEq/L 3.9  Chloride Latest Ref Range:  96 - 112 mEq/L 106  CO2 Latest Ref Range: 19 - 32 mEq/L 27  Glucose Latest Ref Range: 70 - 99 mg/dL 086 (H)  BUN Latest Ref Range: 6 - 23 mg/dL 13  Creatinine Latest Ref Range: 0.40 - 1.20 mg/dL 5.78  Calcium Latest Ref Range: 8.4 - 10.5 mg/dL 9.8  Alkaline Phosphatase Latest Ref Range: 39 - 117 U/L 67  Albumin Latest Ref Range: 3.5 - 5.2 g/dL 4.7  AST Latest Ref Range: 0 - 37 U/L 24  ALT Latest Ref Range: 0 - 35 U/L 36 (H)  Total Protein Latest Ref Range: 6.0 - 8.3 g/dL 7.5  Total Bilirubin Latest Ref Range: 0.2 - 1.2 mg/dL 0.5  GFR Latest Ref Range: >60.00 mL/min 85.88  Total CHOL/HDL Ratio Unknown 3  Cholesterol Latest Ref Range: 0 - 200 mg/dL 469  HDL Cholesterol Latest Ref Range: >39.00 mg/dL 62.95  LDL (calc) Latest Ref Range: 0 - 99 mg/dL 98  MICROALB/CREAT RATIO Latest Ref Range: 0.0 - 30.0 mg/g 0.6  NonHDL Unknown 114.41  Triglycerides Latest Ref Range: 0.0 - 149.0 mg/dL 28.4  VLDL Latest Ref Range: 0.0 - 40.0 mg/dL 13.2  TSH Latest Ref Range: 0.35 - 4.50 uIU/mL 1.37  T4,Free(Direct) Latest Ref Range: 0.60 - 1.60 ng/dL 4.40  Creatinine,U Latest Units: mg/dL 102.7  Microalb, Ur Latest Ref Range: 0.0 - 1.9 mg/dL 1.2    ASSESSMENT / PLAN / RECOMMENDATIONS:   1) Type 2 Diabetes Mellitus, excellent control, Without complications - Most recent A1c of 6.3 %. Goal A1c < 7.0 %.    - She continues with great glycemic control without any glycemic agents  - BMP is normal     EDUCATION / INSTRUCTIONS:  BG monitoring instructions: Patient is instructed to check her blood sugars 2-3 times a week, fasting.  Call [place] [facility] if: BG persistently < 70  . I reviewed the Rule of 15 for the treatment of hypoglycemia in detail with the patient. Literature supplied.    2) Diabetic complications:   Eye: Does not have known diabetic retinopathy.   Neuro/ Feet: Does not have known diabetic peripheral neuropathy .   Renal: Patient does not have known baseline  CKD.   3) Dyslipidemia:  - She had back pain that she attributed to statin use - Has been taking Atorvastatin on average 3x a week  - LDL is less then 100 mg/dL  - No changes   Medications Atorvastatin 20 mg three times a week     F/U in 6 months   Signed electronically by: Lyndle Herrlich, MD  [facility]  [facility] [address] [place], [place] [address] Phone: [phone] FAX: [phone]   CC: Tally Joe, MD [address] [place] [address] Phone: [phone]  Fax: [phone]  Return to Endocrinology clinic as below: Future Appointments  Date Time Provider Department Center  02/21/2021  8:00 AM Croitoru, Rachelle Hora, MD [facility]

## 2021-02-21 ENCOUNTER — Ambulatory Visit: Payer: 59 | Admitting: Cardiovascular Disease

## 2021-02-21 ENCOUNTER — Other Ambulatory Visit: Payer: Self-pay

## 2021-02-21 VITALS — BP 128/72 | HR 82 | Resp 18 | Ht 61.0 in | Wt 213.2 lb

## 2021-02-21 DIAGNOSIS — E119 Type 2 diabetes mellitus without complications: Secondary | ICD-10-CM

## 2021-02-21 DIAGNOSIS — E7800 Pure hypercholesterolemia, unspecified: Secondary | ICD-10-CM

## 2021-02-21 DIAGNOSIS — R55 Syncope and collapse: Secondary | ICD-10-CM | POA: Diagnosis not present

## 2021-02-21 NOTE — Patient Instructions (Signed)
Medication Instructions:  Your physician recommends that you continue on your current medications as directed. Please refer to the Current Medication list given to you today.  *If you need a refill on your cardiac medications before your next appointment, please call your pharmacy*   Lab Work: None ordered  Testing/Procedures: None ordered   Follow-Up: At [facility], you and your health needs are our priority.  As part of our continuing mission to provide you with exceptional heart care, we have created designated Provider Care Teams.  These Care Teams include your primary Cardiologist (physician) and Advanced Practice Providers (APPs -  Physician Assistants and Nurse Practitioners) who all work together to provide you with the care you need, when you need it.  We recommend signing up for the patient portal called "MyChart".  Sign up information is provided on this After Visit Summary.  MyChart is used to connect with patients for Virtual Visits (Telemedicine).  Patients are able to view lab/test results, encounter notes, upcoming appointments, etc.  Non-urgent messages can be sent to your provider as well.   To learn more about what you can do with MyChart, go to [email or web address].    Your next appointment:   12 month(s)  The format for your next appointment:   In Person  Provider:   Mihai Croitoru, MD    

## 2021-02-21 NOTE — Progress Notes (Signed)
Cardiology Office Note:    Date:  02/21/2021   ID:  [name], DOB [birth year], MRN [id number]  PCP:  Tally Joe, MD  Cardiologist:  None  Electrophysiologist:  None   Referring MD: Tally Joe, MD   No chief complaint on file. [name] is a 55 y.o. female who is being seen today for the evaluation of syncope at the request of Tally Joe, MD.   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    She has been well, worried about her husband's health (he had a PE and has prothrombin gene mutation) and her father (he is blind due to glaucoma and has ESRD, she takes him to HD).  The patient specifically denies any chest pain at rest exertion, dyspnea at rest or with exertion, orthopnea, paroxysmal nocturnal dyspnea, syncope, palpitations, focal neurological deficits, intermittent claudication, lower extremity edema, unexplained weight gain, cough, hemoptysis or wheezing.  She struggles with efforts to lose weight, but has recently joined [employer] and plans to exercise regularly.  Her most recent hemoglobin A1c was 6.3% and LDL 98 on 01/11/2021.  Past Medical History:  Diagnosis Date  . Diabetes mellitus without complication [facility])     Past Surgical History:  Procedure Laterality Date  . CESAREAN SECTION    . CHOLECYSTECTOMY      Current Medications: No outpatient medications have been marked as taking for the 02/21/21 encounter (Office Visit) with Thurmon Fair, MD.     Allergies:   Patient has no known allergies.   Social History   Socioeconomic History  . Marital status: Married    Spouse name: Not on file  . Number of children: Not on file  . Years of education: Not on file  . Highest education level: Not on file  Occupational History  . Not on file  Tobacco Use  . Smoking status: Never  Smoker  . Smokeless tobacco: Never Used  Substance and Sexual Activity  . Alcohol use: No    Comment: occassional   . Drug use: No  . Sexual activity: Not on file  Other Topics Concern  . Not on file  Social History Narrative  . Not on file   Social Determinants of Health   Financial Resource Strain: Not on file  Food Insecurity: Not on file  Transportation Needs: Not on file  Physical Activity: Not on file  Stress: Not on file  Social Connections: Not on file     Family History: The patient's family history includes Diabetes in her father; Heart disease in her mother; Hypertension in her father and mother.  ROS:   Please see the history of present illness.     All other systems reviewed and are negative.  EKGs/Labs/Other Studies Reviewed:    The following studies were reviewed today: ECHO 02/17/2020 1. Left ventricular ejection fraction, by estimation, is 65 to 70%. The  left ventricle has normal function. The left ventricle has no regional  wall motion abnormalities. Left ventricular diastolic parameters were  normal.  2. Right ventricular systolic function is normal. The right ventricular  size is normal.  3. The mitral valve is abnormal. Trivial mitral valve regurgitation.  4. The aortic valve is abnormal. Aortic valve regurgitation is not  visualized. Mild aortic valve sclerosis is present, with no evidence of  aortic valve stenosis.  5. The inferior vena cava is normal in size with greater than 50%  respiratory variability, suggesting right atrial pressure of 3 mmHg.   Event monitor 02/28/2020   Normal sinus rhythm with normal circadian variation.  Patient-triggered recordings for palpitations coincide with very rare PVCs.   Normal event monitor. Symptoms appear to be related to premature ventricular contractions, which occur very rarely.    EKG:  EKG is ordered today.  The ekg ordered today demonstrates , normal sinus rhythm, QS pattern in the  anteroseptal leads, unchanged from previous tracing.  Recent Labs: 01/11/2021: ALT 36; BUN 13; Creatinine, Ser 0.78; Potassium 3.9; Sodium 142; TSH 1.37  Recent Lipid Panel    Component Value Date/Time   CHOL 177 01/11/2021 0816   TRIG 80.0 01/11/2021 0816   HDL 62.40 01/11/2021 0816   CHOLHDL 3 01/11/2021 0816   VLDL 16.0 01/11/2021 0816   LDLCALC 98 01/11/2021 0816    Physical Exam:    VS:  BP 128/72   Pulse 82   Resp 18   Ht 5\' 1"  (1.549 m)   Wt 213 lb 3.2 oz (96.7 kg)   BMI 40.28 kg/m     Wt Readings from Last 3 Encounters:  02/21/21 213 lb 3.2 oz (96.7 kg)  01/11/21 212 lb 6.4 oz (96.3 kg)  07/02/20 208 lb (94.3 kg)      General: Alert, oriented x3, no distress, morbidly obese Head: no evidence of trauma, PERRL, EOMI, no exophtalmos or lid lag, no myxedema, no xanthelasma; normal ears, nose and oropharynx Neck: normal jugular venous pulsations and no hepatojugular reflux; brisk carotid pulses without delay and no carotid bruits Chest: clear to auscultation, no signs of consolidation by percussion or palpation, normal fremitus, symmetrical and full respiratory excursions Cardiovascular: normal position and quality of the apical impulse, regular rhythm, normal first and second heart sounds, no murmurs, rubs or gallops Abdomen: no tenderness or distention, no masses by palpation, no abnormal pulsatility or arterial bruits, normal bowel sounds, no hepatosplenomegaly Extremities: no clubbing, cyanosis or edema; 2+ radial, ulnar and brachial pulses bilaterally; 2+ right femoral, posterior tibial and dorsalis pedis pulses; 2+ left femoral, posterior tibial and dorsalis pedis pulses; no subclavian or femoral bruits Neurological: grossly nonfocal Psych: Normal mood and affect   ASSESSMENT:    1. Vasovagal syncope   2. Type 2 diabetes mellitus without complication, without long-term current use of insulin (HCC)   3. Hypercholesterolemia   4. Severe obesity (BMI 35.0-39.9)  with comorbidity (HCC)    PLAN:    In order of problems listed above:  1. Syncope: She is only had 1 event.  No evidence of structural heart disease by echo and no arrhythmia on rhythm monitor. 2. DM: Well-controlled without medications.  Likely able to "cure" her diabetes with substantial weight loss. 3. HLP: Lipid profile has improved further since her pulmonary year ago.  LDL now at target less than 100. 4. Severe obesity: Encouraged her to continue her efforts at weight loss.  Might want to consider GLP-1 agonist [facility]) or even bariatric surgery.  Medication Adjustments/Labs and Tests Ordered: Current medicines are reviewed at length with the patient today.  Concerns regarding medicines are outlined above.  Orders Placed This Encounter  Procedures  . EKG 12-Lead   No orders of the defined types were placed in this encounter.   Patient Instructions  Medication Instructions:  Your physician recommends that you continue on your current medications as directed. Please refer to the Current Medication list given  to you today.  *If you need a refill on your cardiac medications before your next appointment, please call your pharmacy*   Lab Work: None ordered.  Testing/Procedures: None ordered.    Follow-Up: At [facility], you and your health needs are our priority.  As part of our continuing mission to provide you with exceptional heart care, we have created designated Provider Care Teams.  These Care Teams include your primary Cardiologist (physician) and Advanced Practice Providers (APPs -  Physician Assistants and Nurse Practitioners) who all work together to provide you with the care you need, when you need it.  We recommend signing up for the patient portal called "MyChart".  Sign up information is provided on this After Visit Summary.  MyChart is used to connect with patients for Virtual Visits (Telemedicine).  Patients are able to view lab/test results, encounter notes,  upcoming appointments, etc.  Non-urgent messages can be sent to your provider as well.   To learn more about what you can do with MyChart, go to [email or web address].    Your next appointment:   12 month(s)  The format for your next appointment:   In Person  Provider:   Thurmon Fair, MD       Signed, Thurmon Fair, MD  02/21/2021 12:23 PM    [facility]

## 2021-02-23 ENCOUNTER — Other Ambulatory Visit: Payer: Self-pay

## 2021-02-23 ENCOUNTER — Ambulatory Visit
Admission: EM | Admit: 2021-02-23 | Discharge: 2021-02-23 | Disposition: A | Discharge status: Home / Self Care | Payer: 59 | Attending: Family Medicine | Admitting: Family Medicine

## 2021-02-23 ENCOUNTER — Encounter: Payer: Self-pay | Admitting: *Deleted

## 2021-02-23 DIAGNOSIS — R21 Rash and other nonspecific skin eruption: Secondary | ICD-10-CM | POA: Diagnosis not present

## 2021-02-23 DIAGNOSIS — L299 Pruritus, unspecified: Secondary | ICD-10-CM

## 2021-02-23 HISTORY — DX: Hyperlipidemia, unspecified: E78.5

## 2021-02-23 MED ORDER — PERMETHRIN 5 % EX CREA: TOPICAL_CREAM | CUTANEOUS | 1 refills | Status: DC

## 2021-02-23 MED ORDER — PREDNISONE 20 MG PO TABS: 40.0000 mg | ORAL_TABLET | Freq: Every day | ORAL | 0 refills | Status: DC

## 2021-02-23 NOTE — ED Provider Notes (Signed)
[facility]   [id number] 02/23/21 Arrival Time: 0623  ASSESSMENT & PLAN:  1. Rash and nonspecific skin eruption   2. Itching    Unclear etiology. Significant itching. Will tx empirically for scabies also; foreign travel with the past month or so.  Begin: Meds ordered this encounter  Medications  . permethrin (ELIMITE) 5 % cream    Sig: Apply from neck down before bed then wash off in the morning. May repeat in one week.    Dispense:  60 g    Refill:  1  . predniSONE (DELTASONE) 20 MG tablet    Sig: Take 2 tablets (40 mg total) by mouth daily.    Dispense:  10 tablet    Refill:  0     Follow-up Information    Tally Joe, MD.   Specialty: Family Medicine Why: As needed. Contact information: [address] [place] [address] [phone]        [facility] .   Specialty: Urgent Care Why: If worsening or failing to improve as anticipated. Contact information: [address] [id number] [address] [address] [phone]              Reviewed expectations re: course of current medical issues. Questions answered. Outlined signs and symptoms indicating need for more acute intervention. Patient verbalized understanding. After Visit Summary given.   SUBJECTIVE:  [name] is a 55 y.o. female who presents with a skin complaint. "Itchy rash"; abrupt onset; 3-4 d ago; stable; itching seems worse today. Afebrile. Mild cold symptoms recently but these are improving. Benadryl without much relief. No new exposures identified. COVID negative x 2 this week.   OBJECTIVE: Vitals:   02/23/21 0843  BP: 127/82  Pulse: 72  Resp: 20  Temp: 98.9 F (37.2 C)  SpO2: 94%    General appearance: alert; no distress HEENT: [place]; AT Extremities: no edema; moves all extremities normally Skin: warm and dry; signs of infection: no; scattered areas of skin colored small macules/papules;  random distribution; overall hard to see these areas unless she points them out; no open wounds Psychological: alert and cooperative; normal mood and affect  No Known Allergies  Past Medical History:  Diagnosis Date  . Diabetes mellitus without complication (HCC)    used to have elevated A1C  . Hyperlipidemia    Social History   Socioeconomic History  . Marital status: Married    Spouse name: Not on file  . Number of children: Not on file  . Years of education: Not on file  . Highest education level: Not on file  Occupational History  . Not on file  Tobacco Use  . Smoking status: Never Smoker  . Smokeless tobacco: Never Used  Vaping Use  . Vaping Use: Never used  Substance and Sexual Activity  . Alcohol use: No  . Drug use: No  . Sexual activity: Not on file  Other Topics Concern  . Not on file  Social History Narrative  . Not on file   Social Determinants of Health   Financial Resource Strain: Not on file  Food Insecurity: Not on file  Transportation Needs: Not on file  Physical Activity: Not on file  Stress: Not on file  Social Connections: Not on file  Intimate Partner Violence: Not on file   Family History  Problem Relation Age of Onset  . Heart disease Mother   . Hypertension Mother   . Diabetes  Father   . Hypertension Father    Past Surgical History:  Procedure Laterality Date  . CESAREAN SECTION    . Barrington Ellison, MD 02/23/21 [phone]

## 2021-02-23 NOTE — ED Triage Notes (Signed)
Had 2 negative Covid tests this week - has had nasal congestion, runny nose, HA, body aches.  C/O generalized pruritic rash onset approx 4 days ago.

## 2021-02-27 ENCOUNTER — Other Ambulatory Visit: Payer: Self-pay | Admitting: Nurse Practitioner

## 2021-02-27 MED ORDER — HYDROXYZINE HCL 10 MG PO TABS: 10.0000 mg | ORAL_TABLET | Freq: Three times a day (TID) | ORAL | 1 refills | Status: AC | PRN

## 2021-02-27 MED ORDER — FLUCONAZOLE 150 MG PO TABS: 150.0000 mg | ORAL_TABLET | ORAL | 0 refills | Status: AC

## 2021-03-06 ENCOUNTER — Other Ambulatory Visit: Payer: Self-pay | Admitting: Nurse Practitioner

## 2021-03-06 NOTE — Telephone Encounter (Signed)
   Notes to clinic: Requesting a 90 day supply   Requested Prescriptions  Pending Prescriptions Disp Refills   hydrOXYzine (ATARAX/VISTARIL) 10 MG tablet [Pharmacy Med Name: HYDROXYZINE HCL 10 MG TABLET] 270 tablet 1    Sig: Take 1 tablet (10 mg total) by mouth 3 (three) times daily as needed for up to 15 days for itching.      Ear, Nose, and Throat:  Antihistamines Failed - 03/06/2021 12:31 PM      Failed - Valid encounter within last 12 months    Recent Outpatient Visits           7 years ago Cough   Primary [facility] at [facility], MD   7 years ago Cough   Primary Care at [facility], Maylon Peppers, MD

## 2021-03-07 ENCOUNTER — Telehealth: Payer: Self-pay

## 2021-03-07 NOTE — Telephone Encounter (Signed)
error 

## 2021-04-06 ENCOUNTER — Other Ambulatory Visit: Payer: Self-pay | Admitting: Internal Medicine

## 2021-06-26 ENCOUNTER — Other Ambulatory Visit: Payer: Self-pay | Admitting: Internal Medicine

## 2021-06-27 NOTE — Telephone Encounter (Signed)
Please advise 

## 2021-07-16 ENCOUNTER — Encounter: Payer: Self-pay | Admitting: Internal Medicine

## 2021-07-18 ENCOUNTER — Ambulatory Visit: Payer: 59 | Admitting: Internal Medicine

## 2021-07-18 ENCOUNTER — Other Ambulatory Visit: Payer: Self-pay

## 2021-07-18 ENCOUNTER — Encounter: Payer: Self-pay | Admitting: Internal Medicine

## 2021-07-18 VITALS — BP 136/80 | HR 65 | Ht 61.0 in | Wt 214.0 lb

## 2021-07-18 DIAGNOSIS — E119 Type 2 diabetes mellitus without complications: Secondary | ICD-10-CM | POA: Diagnosis not present

## 2021-07-18 DIAGNOSIS — R590 Localized enlarged lymph nodes: Secondary | ICD-10-CM

## 2021-07-18 DIAGNOSIS — E785 Hyperlipidemia, unspecified: Secondary | ICD-10-CM

## 2021-07-18 LAB — POCT GLYCOSYLATED HEMOGLOBIN (HGB A1C): Hemoglobin A1C: 6.6 % — AB (ref 4.0–5.6)

## 2021-07-18 LAB — GLUCOSE, POCT (MANUAL RESULT ENTRY): POC Glucose: 133 mg/dl — AB (ref 70–99)

## 2021-07-18 NOTE — Progress Notes (Signed)
Name: [name]  Age/ Sex: 55 y.o., female   MRN/ DOB: [id number], [birth year]     PCP: Amanda Joe, MD   Reason for Endocrinology Evaluation: Type 2 Diabetes Mellitus  Initial Endocrine Consultative Visit: 08/24/18    PATIENT IDENTIFIER: [name] is a 55 y.o. female with a past medical history of T2DM. The patient has followed with Endocrinology clinic since 08/24/18 for consultative assistance with management of her diabetes.  DIABETIC HISTORY:  [name] was diagnosed with T2DM in October, 2019 with an A1c of 7.2% during routine Gyn appointment.Patient opted for lifestyle changes for management rather than start on any anti glycemic agents.. Her hemoglobin A1c was 7.2 % in 07/2018    Atorvastatin started in 12/2019  She is an occupational nurse  SUBJECTIVE:    Previous Visit (01/11/2021):  A1c was 6.3 % with lifestyle changes. Lipitor started      Today (07/18/2021): [name] is here for a  month follow up on her diabetes management. She checks her blood sugars occasionally. The patient has not had hypoglycemic episodes since the last clinic visit.   She has been undergoing stressful times  Father with glaucoma with ESRD , started HD ~ 11/2019, currently in nursing home  She was recently evaluated through ENT for cervical lymphadenopathy . Neck ultrasound showed multiple enlarged lymph nodes   She noted enlarged right  lymph nodes on 9/18th . Saw the dentist , and oral surgeon , last week noted a new enlarged L.N enlargement , they are tender in nature .   Currently on Augementin   Saw ortho for right knee and achilles tendon - on celebrex       HOME  REGIMEN:  Lipitor 20 mg daily taking it 5x a week     METER DOWNLOAD SUMMARY: 06/2021  Average Number Tests/Day = 0.6 Overall Mean FS Glucose = 140 Standard Deviation = 9  BG Ranges: Low = 125 High = 157    Hypoglycemic Events/30 Days: BG < 50 = 0 Episodes of symptomatic severe  hypoglycemia = 0    DIABETIC COMPLICATIONS: Microvascular complications:  Denies: neuropathy, retinopathy Last eye exam: Completed 05/2020   Macrovascular complications:    Denies: CAD, PVD, CVA   HISTORY:  Past Medical History:  Past Medical History:  Diagnosis Date   Diabetes mellitus without complication (HCC)    used to have elevated A1C   Hyperlipidemia    Past Surgical History:  Past Surgical History:  Procedure Laterality Date   CESAREAN SECTION     CHOLECYSTECTOMY     Social History:  reports that she has never smoked. She has never used smokeless tobacco. She reports that she does not drink alcohol and does not use drugs. Family History:  Family History  Problem Relation Age of Onset   Heart disease Mother    Hypertension Mother    Diabetes Father    Hypertension Father      HOME MEDICATIONS: Allergies as of 07/18/2021   No Known Allergies      Medication List        Accurate as of July 18, 2021  8:18 AM. If you have any questions, ask your nurse or doctor.          STOP taking these medications    permethrin 5 % cream Commonly known as: ELIMITE Stopped by: Amanda Shorts, MD   predniSONE 20 MG tablet Commonly known as: DELTASONE Stopped by: Amanda Shorts, MD  TAKE these medications    atorvastatin 20 MG tablet Commonly known as: LIPITOR TAKE 1 TABLET (20 MG TOTAL) BY MOUTH AS DIRECTED. THREE TIMES A WEEK         OBJECTIVE:   Vital Signs: BP 136/80 (BP Location: Left Arm, Patient Position: Sitting, Cuff Size: Small)   Pulse 65   Ht 5\' 1"  (1.549 m)   Wt 214 lb (97.1 kg)   SpO2 96%   BMI 40.43 kg/m   Wt Readings from Last 3 Encounters:  07/18/21 214 lb (97.1 kg)  02/21/21 213 lb 3.2 oz (96.7 kg)  01/11/21 212 lb 6.4 oz (96.3 kg)     Exam: General: Pt appears well and is in NAD Patient has sublingual l localized enlargement, soft, mobile and tender she also has a firm localized enlargement at  the right mandibular border, firm and immobile  Lungs: Clear with good BS bilat with no rales, rhonchi, or wheezes  Heart: RRR   Abdomen: Normoactive bowel sounds, soft, nontender, without masses or organomegaly palpable  Extremities: No pretibial edema.   Neuro: MS is good with appropriate affect, pt is alert and Ox3    DM foot exam: 01/11/2021 The skin of the feet is intact without sores or ulcerations. The pedal pulses are 2+ on right and 2+ on left. The sensation is intact to a screening 5.07, 10 gram monofilament bilaterally       DATA REVIEWED:  Lab Results  Component Value Date   HGBA1C 6.6 (A) 07/18/2021   HGBA1C 6.3 (A) 01/11/2021   HGBA1C 6.1 (A) 07/02/2020      [name], [name] (MRN [name]) as of 01/13/2021 21:06  Ref. Range 01/11/2021 08:16  Sodium Latest Ref Range: 135 - 145 mEq/L 142  Potassium Latest Ref Range: 3.5 - 5.1 mEq/L 3.9  Chloride Latest Ref Range: 96 - 112 mEq/L 106  CO2 Latest Ref Range: 19 - 32 mEq/L 27  Glucose Latest Ref Range: 70 - 99 mg/dL 01/13/2021 (H)  BUN Latest Ref Range: 6 - 23 mg/dL 13  Creatinine Latest Ref Range: 0.40 - 1.20 mg/dL 893  Calcium Latest Ref Range: 8.4 - 10.5 mg/dL 9.8  Alkaline Phosphatase Latest Ref Range: 39 - 117 U/L 67  Albumin Latest Ref Range: 3.5 - 5.2 g/dL 4.7  AST Latest Ref Range: 0 - 37 U/L 24  ALT Latest Ref Range: 0 - 35 U/L 36 (H)  Total Protein Latest Ref Range: 6.0 - 8.3 g/dL 7.5  Total Bilirubin Latest Ref Range: 0.2 - 1.2 mg/dL 0.5  GFR Latest Ref Range: >60.00 mL/min 85.88  Total CHOL/HDL Ratio Unknown 3  Cholesterol Latest Ref Range: 0 - 200 mg/dL 8.10  HDL Cholesterol Latest Ref Range: >39.00 mg/dL 175  LDL (calc) Latest Ref Range: 0 - 99 mg/dL 98  MICROALB/CREAT RATIO Latest Ref Range: 0.0 - 30.0 mg/g 0.6  NonHDL Unknown 114.41  Triglycerides Latest Ref Range: 0.0 - 149.0 mg/dL 10.25  VLDL Latest Ref Range: 0.0 - 40.0 mg/dL 85.2  TSH Latest Ref Range: 0.35 - 4.50 uIU/mL 1.37   T4,Free(Direct) Latest Ref Range: 0.60 - 1.60 ng/dL 77.8  Creatinine,U Latest Units: mg/dL 2.42  Microalb, Ur Latest Ref Range: 0.0 - 1.9 mg/dL 1.2     ULtrasound Head/neck 07/16/2021  Right greater then left level 1 lymphadenopathy, with dominant hypervascular 19 mm short axis node on the right but multiple additional abnormality around 8-13 mm   Regional mass or infection might results in this appearance  ASSESSMENT / PLAN / RECOMMENDATIONS:   1) Type 2 Diabetes Mellitus, excellent control, Without complications - Most recent A1c of 6.6 %. Goal A1c < 7.0 %.    - She continues with great glycemic control without any glycemic agents  -I have encouraged her to continue lifestyle changes, no intervention needed at this time    EDUCATION / INSTRUCTIONS: BG monitoring instructions: Patient is instructed to check her blood sugars 2-3 times a week, fasting. Call [facility] if: BG persistently < 70  I reviewed the Rule of 15 for the treatment of hypoglycemia in detail with the patient. Literature supplied.    2) Diabetic complications:  Eye: Does not have known diabetic retinopathy.  Neuro/ Feet: Does not have known diabetic peripheral neuropathy .  Renal: Patient does not have known baseline CKD.   3) Dyslipidemia:  - She had back pain that she attributed to statin use in the past - Has been taking Atorvastatin on average 5-6x a week  - LDL has been at goal - No changes   Medications Continue atorvastatin 20 mg 5-6 times a week    4) Cervical lymphadenopathy:   -Based on clinical scenario and ultrasound results this has been attributed to infection/inflammation. -She is currently on Augmentin -She was prescribed prednisone but has not started yet, patient may start this -Her central lymph node swelling has reduced but the right node has not decreased in size yet -She has a pending follow-up with ENT in October -Reassurance provided at this point,  if she remains very concerned about this patient may seek oncology opinion if no improvement    F/U in 4 months   Signed electronically by: Lyndle Herrlich, MD  [facility] Endocrinology  [facility] [address] [place] [place], [place] [address] Phone: [phone] FAX: [phone]   CC: Amanda Joe, MD [address] [place] [address] Phone: [phone]  Fax: [phone]  Return to Endocrinology clinic as below: No future appointments.

## 2021-07-19 ENCOUNTER — Ambulatory Visit: Payer: 59 | Admitting: Internal Medicine

## 2021-08-26 ENCOUNTER — Encounter: Payer: Self-pay | Admitting: Internal Medicine

## 2021-08-27 NOTE — Telephone Encounter (Signed)
Spoke to the pt on 08/27/2021 at ~ 8 am. 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

## 2021-11-11 ENCOUNTER — Encounter: Payer: Self-pay | Admitting: Internal Medicine

## 2021-11-11 ENCOUNTER — Ambulatory Visit: Payer: 59 | Admitting: Internal Medicine

## 2021-11-11 ENCOUNTER — Other Ambulatory Visit: Payer: Self-pay

## 2021-11-11 VITALS — BP 140/82 | HR 103 | Ht 61.0 in | Wt 199.0 lb

## 2021-11-11 DIAGNOSIS — E119 Type 2 diabetes mellitus without complications: Secondary | ICD-10-CM

## 2021-11-11 DIAGNOSIS — E785 Hyperlipidemia, unspecified: Secondary | ICD-10-CM | POA: Diagnosis not present

## 2021-11-11 LAB — POCT GLYCOSYLATED HEMOGLOBIN (HGB A1C): Hemoglobin A1C: 8.2 % — AB (ref 4.0–5.6)

## 2021-11-11 NOTE — Progress Notes (Signed)
Name: [name]  Age/ Sex: 56 y.o., female   MRN/ DOB: [id number], February 01, 1966     PCP: Tally Joe, MD   Reason for Endocrinology Evaluation: Type 2 Diabetes Mellitus  Initial Endocrine Consultative Visit: 08/24/18    PATIENT IDENTIFIER: [name] is a 56 y.o. female with a past medical history of T2DM, T-cell lymphoma (Dx 08/2021). The patient has followed with Endocrinology clinic since 08/24/18 for consultative assistance with management of her diabetes.  DIABETIC HISTORY:   [name] was diagnosed with T2DM in October, 2019 with an A1c of 7.2% during routine Gyn appointment.Patient opted for lifestyle changes for management rather than start on any anti glycemic agents.. Her hemoglobin A1c was 7.2 % in 07/2018    Atorvastatin started in 12/2019  She is an occupational nurse  SUBJECTIVE:    Previous Visit (07/18/2021):  A1c was 6.6 % with lifestyle changes. Lipitor started      Today (11/11/2021): [name] is here for a  month follow up on her diabetes management. She checks her blood sugars 2 times daily. The patient has not had hypoglycemic episodes since the last clinic visit.     Since her last visit here she has been diagnosed with T-cell lymphoma She is on chemotherapy requiring 5 days of prednisone with chemo ([id number]-bmez ) per oncology she has excellent prognosis and would not require any stem cell transplant.  (ZIEXTENZO) injection 6 mg   6 mg Once, Subcutaneous, On Tue 10/29/21 at 1330, For 1 dose   Currently undergoing counseling    She is tired within the 2 weeks of chemo , this is usually follows with severe nausea and vomiting  EMS called due to hyperglycemia BG 300 mg/dL    HOME  REGIMEN:  Lipitor 20 mg daily taking it 5x a week    METER DOWNLOAD SUMMARY: 1/10-1/23/2023 Fingerstick Blood Glucose Tests = 15 Overall Mean FS Glucose = 186 Standard Deviation = 74  BG Ranges: Low = 105 High = 342   Hypoglycemic Events/30  Days: BG < 50 = 0 Episodes of symptomatic severe hypoglycemia = 0    DIABETIC COMPLICATIONS: Microvascular complications:  Denies: neuropathy, retinopathy Last eye exam: Completed 05/2020   Macrovascular complications:    Denies: CAD, PVD, CVA   HISTORY:  Past Medical History:  Past Medical History:  Diagnosis Date   Diabetes mellitus without complication (HCC)    used to have elevated A1C   Hyperlipidemia    Past Surgical History:  Past Surgical History:  Procedure Laterality Date   CESAREAN SECTION     CHOLECYSTECTOMY     Social History:  reports that she has never smoked. She has never used smokeless tobacco. She reports that she does not drink alcohol and does not use drugs. Family History:  Family History  Problem Relation Age of Onset   Heart disease Mother    Hypertension Mother    Diabetes Father    Hypertension Father      HOME MEDICATIONS: Allergies as of 11/11/2021   No Known Allergies      Medication List        Accurate as of November 11, 2021  2:01 PM. If you have any questions, ask your nurse or doctor.          atorvastatin 20 MG tablet Commonly known as: LIPITOR TAKE 1 TABLET (20 MG TOTAL) BY MOUTH AS DIRECTED. THREE TIMES A WEEK         OBJECTIVE:  Vital Signs: BP 140/82 (BP Location: Left Arm, Patient Position: Sitting, Cuff Size: Large)    Pulse (!) 103    Ht 5\' 1"  (1.549 m)    Wt 199 lb (90.3 kg)    SpO2 96%    BMI 37.60 kg/m   Wt Readings from Last 3 Encounters:  11/11/21 199 lb (90.3 kg)  07/18/21 214 lb (97.1 kg)  02/21/21 213 lb 3.2 oz (96.7 kg)     Exam: General: Pt appears well and is in NAD Patient has sublingual l localized enlargement, soft, mobile and tender she also has a firm localized enlargement at the right mandibular border, firm and immobile  Lungs: Clear with good BS bilat with no rales, rhonchi, or wheezes  Heart: RRR   Abdomen: Normoactive bowel sounds, soft, nontender, without masses or  organomegaly palpable  Extremities: No pretibial edema.   Neuro: MS is good with appropriate affect, pt is alert and Ox3    DM foot exam: 11/11/2021 The skin of the feet is intact without sores or ulcerations. The pedal pulses are 2+ on right and 2+ on left. The sensation is intact to a screening 5.07, 10 gram monofilament bilaterally       DATA REVIEWED:  Lab Results  Component Value Date   HGBA1C 8.2 (A) 11/11/2021   HGBA1C 6.6 (A) 07/18/2021   HGBA1C 6.3 (A) 01/11/2021    Component 10/28/2021  Sodium 138  Potassium 3.8  Chloride 104  CO2 25  BUN 9  Glucose 161 High      Creatinine 0.64  Calcium 9.5  Total Protein 6.9  Albumin  4.2  Total Bilirubin 0.4  Alkaline Phosphatase 79  AST (SGOT) 23  ALT (SGPT) 25    ASSESSMENT / PLAN / RECOMMENDATIONS:   1) Type 2 Diabetes Mellitus, Sub-Optimally , Without complications - Most recent A1c of 8.2 %. Goal A1c < 7.0 %.    -Patient with hyperglycemia due to chemotherapy and glucocorticoid use -At this time she is in survival mode and is having to consume some carbohydrates to ease of nausea that is brought on by chemotherapy -We have discussed treatment options to include sulfonylurea versus short acting insulin per correction scale.  Patient has 2 more cycles of chemotherapy, and would like to continue to monitor at this time without any glycemic agents -We discussed the effects of glucocorticoids and increasing carbohydrate sensitivity   EDUCATION / INSTRUCTIONS: BG monitoring instructions: Patient is instructed to check her blood sugars 2-3 times a week, fasting. Call [facility] if: BG persistently < 70  I reviewed the Rule of 15 for the treatment of hypoglycemia in detail with the patient. Literature supplied.    2) Diabetic complications:  Eye: Does not have known diabetic retinopathy.  Neuro/ Feet: Does not have known diabetic peripheral neuropathy .  Renal: Patient does not have known  baseline CKD.   3) Dyslipidemia:  -She has had back issues in the past which resulted in intermittent intake of atorvastatin.  Here lately and due to persistent nausea from chemotherapy she tends to be more compliant with taking it on week 3 and 4 after chemo.   - No changes -We will check on next visit   Medications Continue atorvastatin 20 mg , takes 5-6 times a week        F/U in 4 months   Signed electronically by: Lyndle HerrlichAbby Jaralla Nolie Bignell, MD  [facility]  [facility] [address] [place], [place] [address]  Phone: [phone] FAX: [phone]   CC: Tally Joe, MD [phone] [place] [address] Phone: [phone]  Fax: [phone]  Return to Endocrinology clinic as below: Future Appointments  Date Time Provider Department Center  02/17/2022  7:30 AM Ginamarie Banfield, Konrad Dolores, MD [id number] None

## 2021-11-15 ENCOUNTER — Encounter: Payer: Self-pay | Admitting: Internal Medicine

## 2021-11-18 ENCOUNTER — Ambulatory Visit: Payer: 59 | Admitting: Internal Medicine

## 2021-12-09 ENCOUNTER — Other Ambulatory Visit: Payer: Self-pay

## 2021-12-09 MED ORDER — ATORVASTATIN CALCIUM 20 MG PO TABS: 20.0000 mg | ORAL_TABLET | ORAL | 3 refills | Status: DC

## 2022-01-27 ENCOUNTER — Other Ambulatory Visit: Payer: Self-pay | Admitting: Nurse Practitioner

## 2022-01-27 DIAGNOSIS — R21 Rash and other nonspecific skin eruption: Secondary | ICD-10-CM

## 2022-01-27 MED ORDER — METHYLPREDNISOLONE 4 MG PO TBPK: ORAL_TABLET | ORAL | 0 refills | Status: DC

## 2022-02-17 ENCOUNTER — Other Ambulatory Visit: Payer: Self-pay | Admitting: Physician Assistant

## 2022-02-17 ENCOUNTER — Ambulatory Visit: Payer: 59 | Admitting: Internal Medicine

## 2022-02-17 ENCOUNTER — Encounter: Payer: Self-pay | Admitting: Internal Medicine

## 2022-02-17 VITALS — BP 136/84 | HR 93 | Ht 61.0 in | Wt 196.6 lb

## 2022-02-17 DIAGNOSIS — R21 Rash and other nonspecific skin eruption: Secondary | ICD-10-CM | POA: Insufficient documentation

## 2022-02-17 DIAGNOSIS — E119 Type 2 diabetes mellitus without complications: Secondary | ICD-10-CM

## 2022-02-17 DIAGNOSIS — E785 Hyperlipidemia, unspecified: Secondary | ICD-10-CM | POA: Diagnosis not present

## 2022-02-17 DIAGNOSIS — R739 Hyperglycemia, unspecified: Secondary | ICD-10-CM

## 2022-02-17 LAB — BASIC METABOLIC PANEL
BUN: 19 mg/dL (ref 6–23)
CO2: 28 mEq/L (ref 19–32)
Calcium: 9.8 mg/dL (ref 8.4–10.5)
Chloride: 104 mEq/L (ref 96–112)
Creatinine, Ser: 0.73 mg/dL (ref 0.40–1.20)
GFR: 92.27 mL/min (ref >60.00)
Glucose, Bld: 125 mg/dL — ABNORMAL HIGH (ref 70–99)
Potassium: 4.2 mEq/L (ref 3.5–5.1)
Sodium: 142 mEq/L (ref 135–145)

## 2022-02-17 LAB — LIPID PANEL
Cholesterol: 222 mg/dL — ABNORMAL HIGH (ref 0–200)
HDL: 80.4 mg/dL (ref >39.00)
LDL Cholesterol: 126 mg/dL — ABNORMAL HIGH (ref 0–99)
NonHDL: 141.72
Total CHOL/HDL Ratio: 3
Triglycerides: 79 mg/dL (ref 0.0–149.0)
VLDL: 15.8 mg/dL (ref 0.0–40.0)

## 2022-02-17 LAB — MICROALBUMIN / CREATININE URINE RATIO
Creatinine,U: 116.4 mg/dL
Microalb Creat Ratio: 0.7 mg/g (ref 0.0–30.0)
Microalb, Ur: 0.8 mg/dL (ref 0.0–1.9)

## 2022-02-17 LAB — POCT GLYCOSYLATED HEMOGLOBIN (HGB A1C): Hemoglobin A1C: 6.4 % — AB (ref 4.0–5.6)

## 2022-02-17 LAB — T4, FREE: Free T4: 0.86 ng/dL (ref 0.60–1.60)

## 2022-02-17 LAB — TSH: TSH: 1.12 u[IU]/mL (ref 0.35–5.50)

## 2022-02-17 LAB — POCT GLUCOSE (DEVICE FOR HOME USE): Glucose Fasting, POC: 153 mg/dL — AB (ref 70–99)

## 2022-02-17 MED ORDER — ATORVASTATIN CALCIUM 20 MG PO TABS: 20.0000 mg | ORAL_TABLET | Freq: Every day | ORAL | 3 refills | Status: DC

## 2022-02-17 NOTE — Progress Notes (Signed)
? ?Name: [name]  ?Age/ Sex: 56 y.o., female   ?MRN/ DOB: [id number], 03/19/1966    ? ?PCP: Tally JoeSwayne, David, MD   ?Reason for Endocrinology Evaluation: Type 2 Diabetes Mellitus  ?Initial Endocrine Consultative Visit: 08/24/18  ? ? ?PATIENT IDENTIFIER: [name] is a 56 y.o. female with a past medical history of T2DM, T-cell lymphoma (Dx 08/2021). The patient has followed with Endocrinology clinic since 08/24/18 for consultative assistance with management of her diabetes. ? ?DIABETIC HISTORY:  ? ?[name] was diagnosed with T2DM in October, 2019 with an A1c of 7.2% during routine Gyn appointment.Patient opted for lifestyle changes for management rather than start on any anti glycemic agents.. Her hemoglobin A1c was 7.2 % in 07/2018 ? ? ? ?Atorvastatin started in 12/2019 ? ?She is an occupational nurse ? ?SUBJECTIVE:  ? ? ?Previous Visit (11/11/2021):  A1c was 8.2% opted not to start any glycemic agents.  Lipitor continued ? ? ? ? ?Today (02/17/2022): [name] is here for a  month follow up on her diabetes management. She checks her blood sugars 2 times daily. The patient has not had hypoglycemic episodes since the last clinic visit.  ? ? ? ?She continues to follow-up with oncology for T-cell lymphoma ?She has completed chemotherapy February 2023.  She had a recent PET scan showing stability. ? ? ?She was seen in urgent care on 01/31/2022 for skin rash, she was prescribed doxycycline and triamcinolone cream as well as fluconazole. Saw dermatology  ? ?She continues with rash over the trunk, but its not symptomatic  ?Lost her dad 3 weeks ago  ? ? ? ? ?HOME  REGIMEN:  ?Lipitor 20 mg daily taking it 5x a week  ? ? ?METER DOWNLOAD SUMMARY: Did not bring  ? ? ? ?DIABETIC COMPLICATIONS: ?Microvascular complications:  ?Denies: neuropathy, retinopathy ?Last eye exam: Completed 01/09/2022 ?  ?Macrovascular complications:  ?  ?Denies: CAD, PVD, CVA ? ? ?HISTORY:  ?Past Medical History:  ?Past Medical History:   ?Diagnosis Date  ? Diabetes mellitus without complication (HCC)   ? used to have elevated A1C  ? Hyperlipidemia   ? ?Past Surgical History:  ?Past Surgical History:  ?Procedure Laterality Date  ? CESAREAN SECTION    ? CHOLECYSTECTOMY    ? ?Social History:  reports that she has never smoked. She has never used smokeless tobacco. She reports that she does not drink alcohol and does not use drugs. ?Family History:  ?Family History  ?Problem Relation Age of Onset  ? Heart disease Mother   ? Hypertension Mother   ? Diabetes Father   ? Hypertension Father   ? ? ? ?HOME MEDICATIONS: ?Allergies as of 02/17/2022   ?No Known Allergies ?  ? ?  ?Medication List  ?  ? ?  ? Accurate as of Feb 17, 2022  7:30 AM. If you have any questions, ask your nurse or doctor.  ?  ?  ? ?  ? ?ascorbic acid 100 MG tablet ?Commonly known as: VITAMIN C ?Take 1 tablet by mouth daily. ?  ?atorvastatin 20 MG tablet ?Commonly known as: LIPITOR ?Take 1 tablet (20 mg total) by mouth as directed. 5-6 times a weekTAKE 1 TABLET (20 MG TOTAL) BY MOUTH AS DIRECTED. ?  ?doxycycline 100 MG capsule ?Commonly known as: VIBRAMYCIN ?Take 100 mg by mouth 2 (two) times daily. ?  ?methylPREDNISolone 4 MG Tbpk tablet ?Commonly known as: MEDROL DOSEPAK ?Day 1: Take 12 mg (3 tablets) before breakfast and 12  mg (3 tablets) after supper Day 2: Take 12 mg (3 tablets) before breakfast and 8 mg (2 tablets ) after supper  Day 3: Take  8 mg (2 tablets) before breakfast and 8 mg (2 tablets) after supper  Day 4: Take 8 mg (2 tablets) before breakfast, and 4 mg (1 tablet) after supper Day 5: Take  4 mg (1 tablet) before breakfast and 4 mg (1 tablet) after supper Day 6: Take 4 mg (1 tablet) before breakfast ?  ? ?  ? ? ? ?OBJECTIVE:  ? ?Vital Signs: Ht 5\' 1"  (1.549 m)   Wt 196 lb 9.6 oz (89.2 kg)   BMI 37.15 kg/m?   ?Wt Readings from Last 3 Encounters:  ?02/17/22 196 lb 9.6 oz (89.2 kg)  ?11/11/21 199 lb (90.3 kg)  ?07/18/21 214 lb (97.1 kg)  ? ? ? ?Exam: ?General: Pt appears well  and is in NAD ?She has target lesions  ?  ?Lungs: Clear with good BS bilat with no rales, rhonchi, or wheezes  ?Heart: RRR   ?Abdomen: Normoactive bowel sounds, soft, nontender, without masses or organomegaly palpable  ?Extremities: No pretibial edema.   ?Neuro: MS is good with appropriate affect, pt is alert and Ox3  ? ? ?DM foot exam: 11/11/2021 ?The skin of the feet is intact without sores or ulcerations. ?The pedal pulses are 2+ on right and 2+ on left. ?The sensation is intact to a screening 5.07, 10 gram monofilament bilaterally ?  ? ? ? ? ?DATA REVIEWED: ? ?Lab Results  ?Component Value Date  ? HGBA1C 8.2 (A) 11/11/2021  ? HGBA1C 6.6 (A) 07/18/2021  ? HGBA1C 6.3 (A) 01/11/2021  ? ? ? Latest Reference Range & Units 02/17/22 08:08  ?Sodium 135 - 145 mEq/L 142  ?Potassium 3.5 - 5.1 mEq/L 4.2  ?Chloride 96 - 112 mEq/L 104  ?CO2 19 - 32 mEq/L 28  ?Glucose 70 - 99 mg/dL 04/19/22 (H)  ?BUN 6 - 23 mg/dL 19  ?Creatinine 0.40 - 1.20 mg/dL 675  ?Calcium 8.4 - 10.5 mg/dL 9.8  ?GFR >60.00 mL/min 92.27  ?Total CHOL/HDL Ratio  3  ?Cholesterol 0 - 200 mg/dL 4.49 (H)  ?HDL Cholesterol >39.00 mg/dL 201  ?LDL (calc) 0 - 99 mg/dL 00.71 (H)  ?MICROALB/CREAT RATIO 0.0 - 30.0 mg/g 0.7  ?NonHDL  141.72  ?Triglycerides 0.0 - 149.0 mg/dL 219  ?VLDL 0.0 - 40.0 mg/dL 75.8  ? ? Latest Reference Range & Units 02/17/22 08:08  ?TSH 0.35 - 5.50 uIU/mL 1.12  ?T4,Free(Direct) 0.60 - 1.60 ng/dL 04/19/22  ? ? Latest Reference Range & Units 02/17/22 08:08  ?Creatinine,U mg/dL 04/19/22  ?Microalb, Ur 0.0 - 1.9 mg/dL 0.8  ?MICROALB/CREAT RATIO 0.0 - 30.0 mg/g 0.7  ? ? ?01/23/2022 ?NA 141 ?Potassium 3.9 ?BUN 14 ?Glucose 134 ?Creatinine 0.61 ?GFR> 90 ?H/H11.7/34.8 ? ?ASSESSMENT / PLAN / RECOMMENDATIONS:  ? ?1) Type 2 Diabetes Mellitus, Optimally , Without complications - Most recent A1c of 6.4 %. Goal A1c < 7.0 %.   ? ? ?-Praised patient on optimizing glucose control ?-Encouraged to continue with lifestyle changes ? ? ? ? ?EDUCATION / INSTRUCTIONS: ?BG monitoring  instructions: Patient is instructed to check her blood sugars 2-3 times a week, fasting. ?Call [facility] if: BG persistently < 70  ?I reviewed the Rule of 15 for the treatment of hypoglycemia in detail with the patient. Literature supplied. ? ? ? ?2) Diabetic complications:  ?Eye: Does not have known diabetic retinopathy.  ?Neuro/ Feet: Does not  have known diabetic peripheral neuropathy .  ?Renal: Patient does not have known baseline CKD. ? ? ?3) Dyslipidemia: ? ?-LDL has trended up, will encourage atorvastatin use ?-A refill was placed today ? ? ? ?Medications ?Continue atorvastatin 20 mg , daily ? ?4) Target lesions : ? ?-She has been on glucocorticoids as well as doxycycline without help.  These lesions do look more like target lesions.  She is going to follow-up with dermatology today ? ? ?5)Tachycardia: ? ?-She has no tachycardia today, denies palpitations, attributes this to emotional stress ?-TFTs are normal ? ?F/U in 6 months ? ? ?Signed electronically by: ?Abby Raelyn Mora, MD ? ?[facility]  ?[facility] ?[address] ?[place], [place] [address] ?Phone: [phone] ?FAX: [phone] ? ? ?CC: ?Tally Joe, MD ?[phone] ?[place] [address] ?Phone: [phone]  ?Fax: [phone] ? ?Return to Endocrinology clinic as below: ?No future appointments. ? ?  ? ? ?

## 2022-06-26 ENCOUNTER — Encounter: Payer: Self-pay | Admitting: Internal Medicine

## 2022-06-27 ENCOUNTER — Telehealth: Payer: Self-pay | Admitting: Internal Medicine

## 2022-06-27 NOTE — Telephone Encounter (Signed)
I did speak to Ms. Dutson per her request on 06/28/19/2023   The patient had no concerns about her glycemic control, her BG's have been controlled per patient   Unfortunately she has been diagnosed with disease progression of large cell lymphoma with a PET scan done in July 2023  She is currently ongoing chemotherapy Pending bone marrow transplant   I have advised the patient to contact us with persistent hyperglycemia >180 mg/dL    She may end up having to cancel her October appointment, I did encourage the patient to cancel as soon as she knows she is not able to make it    Abby Raelyn Mora, MD  [facility]  [facility] [address] [place], [place] [address] Phone: [phone] FAX: [phone]

## 2022-07-17 ENCOUNTER — Ambulatory Visit: Payer: 59 | Admitting: Cardiovascular Disease

## 2022-08-25 ENCOUNTER — Telehealth: Payer: Self-pay | Admitting: Internal Medicine

## 2022-08-25 NOTE — Telephone Encounter (Signed)
Patient is calling to reschedule her appointment because she had a bone marrow transplant last Friday.  Per patient she was advised to discontinue her cholesterol medication and to limit her travel due to immune deficiency. Patient wanted to make sure that Dr. Kelton Pillar knew of this.

## 2022-08-26 NOTE — Telephone Encounter (Signed)
Patient had rescheduled to 11/07/2022 and prefers to keep that appointment rather than rescheduling to later date.

## 2022-09-01 ENCOUNTER — Ambulatory Visit: Payer: 59 | Admitting: Internal Medicine

## 2022-11-07 ENCOUNTER — Encounter: Payer: Self-pay | Admitting: Internal Medicine

## 2022-11-07 ENCOUNTER — Ambulatory Visit: Payer: 59 | Admitting: Internal Medicine

## 2022-11-07 VITALS — BP 124/80 | HR 79 | Ht 61.0 in | Wt 203.0 lb

## 2022-11-07 DIAGNOSIS — E119 Type 2 diabetes mellitus without complications: Secondary | ICD-10-CM

## 2022-11-07 DIAGNOSIS — E785 Hyperlipidemia, unspecified: Secondary | ICD-10-CM

## 2022-11-07 LAB — LIPID PANEL
Cholesterol: 276 mg/dL — ABNORMAL HIGH (ref 0–200)
HDL: 73.6 mg/dL (ref >39.00)
LDL Cholesterol: 178 mg/dL — ABNORMAL HIGH (ref 0–99)
NonHDL: 202.88
Total CHOL/HDL Ratio: 4
Triglycerides: 123 mg/dL (ref 0.0–149.0)
VLDL: 24.6 mg/dL (ref 0.0–40.0)

## 2022-11-07 LAB — POCT GLYCOSYLATED HEMOGLOBIN (HGB A1C): Hemoglobin A1C: 6.4 % — AB (ref 4.0–5.6)

## 2022-11-07 LAB — T4, FREE: Free T4: 0.82 ng/dL (ref 0.60–1.60)

## 2022-11-07 LAB — MICROALBUMIN / CREATININE URINE RATIO
Creatinine,U: 206.5 mg/dL
Microalb Creat Ratio: 0.6 mg/g (ref 0.0–30.0)
Microalb, Ur: 1.3 mg/dL (ref 0.0–1.9)

## 2022-11-07 LAB — POCT GLUCOSE (DEVICE FOR HOME USE): POC Glucose: 152 mg/dl — AB (ref 70–99)

## 2022-11-07 LAB — TSH: TSH: 1.28 u[IU]/mL (ref 0.35–5.50)

## 2022-11-07 NOTE — Progress Notes (Signed)
Name: [name]  Age/ Sex: 57 y.o., female   MRN/ DOB: [id number], 11/10/1965     PCP: Antony Contras, MD   Reason for Endocrinology Evaluation: Type 2 Diabetes Mellitus  Initial Endocrine Consultative Visit: 08/24/18    PATIENT IDENTIFIER: [name] is a 57 y.o. female with a past medical history of T2DM, T-cell lymphoma (Dx 08/2021). The patient has followed with Endocrinology clinic since 08/24/18 for consultative assistance with management of her diabetes.  DIABETIC HISTORY:   [name] was diagnosed with T2DM in October, 2019 with an A1c of 7.2% during routine Gyn appointment.Patient opted for lifestyle changes for management rather than start on any anti glycemic agents.. Her hemoglobin A1c was 7.2 % in 07/2018    Atorvastatin started in 12/2019  She is an occupational nurse  SUBJECTIVE:    Previous Visit (11/11/2021):  A1c was 8.2% opted not to start any glycemic agents.  Lipitor continued     Today (11/07/2022): [name] is here for a  month follow up on her diabetes management. She checks her blood sugars occasionally . The patient has not had hypoglycemic episodes since the last clinic visit.     She continues to follow-up with oncology for T-cell lymphoma She has completed chemotherapy February 2023.   She was started on chemotherapy gemcitabine/dexamethasone/carboplatin 06/08/2022 - 07/02/2022 She received supportive polarity except for 10/9 - 07/29/2022 S/p BMT PM lymphoma 08/05/2022 She is s/p autologous stem cell reinfusion 08/11/2022  She was taken off Atorvastatin due to interaction with chemo  She has pruritus and hypopigmented lesions but no rash per se Denies nausea, constipation, or diarrhea   HOME  REGIMEN:  N/A  METER DOWNLOAD SUMMARY: Did not bring     DIABETIC COMPLICATIONS: Microvascular complications:  Denies: neuropathy, retinopathy Last eye exam: Completed 01/09/2022   Macrovascular complications:    Denies: CAD, PVD,  CVA   HISTORY:  Past Medical History:  Past Medical History:  Diagnosis Date   Diabetes mellitus without complication ([place])    used to have elevated A1C   Hyperlipidemia    Past Surgical History:  Past Surgical History:  Procedure Laterality Date   CESAREAN SECTION     CHOLECYSTECTOMY     Social History:  reports that she has never smoked. She has never used smokeless tobacco. She reports that she does not drink alcohol and does not use drugs. Family History:  Family History  Problem Relation Age of Onset   Heart disease Mother    Hypertension Mother    Diabetes Father    Hypertension Father      HOME MEDICATIONS: Allergies as of 11/07/2022   No Known Allergies      Medication List        Accurate as of November 07, 2022  8:45 AM. If you have any questions, ask your nurse or doctor.          STOP taking these medications    doxycycline 100 MG capsule Commonly known as: VIBRAMYCIN Stopped by: Dorita Sciara, MD   methylPREDNISolone 4 MG Tbpk tablet Commonly known as: MEDROL DOSEPAK Stopped by: Dorita Sciara, MD       TAKE these medications    acyclovir 400 MG tablet Commonly known as: ZOVIRAX Take 800 mg by mouth 2 (two) times daily.   ascorbic acid 100 MG tablet Commonly known as: VITAMIN C Take 1 tablet by mouth daily.   atorvastatin 20 MG tablet Commonly known as: LIPITOR Take 1 tablet (  20 mg total) by mouth daily.   folic acid 1 MG tablet Commonly known as: FOLVITE Take 1 mg by mouth daily.   MULTIVITAMIN ADULT PO Take 1 tablet by mouth daily at 6 (six) AM.   sulfamethoxazole-trimethoprim 800-160 MG tablet Commonly known as: BACTRIM DS Take 1 tablet by mouth once.         OBJECTIVE:   Vital Signs: BP 124/80 (BP Location: Left Arm, Patient Position: Sitting, Cuff Size: Large)   Pulse 79   Ht 5\' 1"  (1.549 m)   Wt 203 lb (92.1 kg)   SpO2 96%   BMI 38.36 kg/m   Wt Readings from Last 3 Encounters:  11/07/22 203  lb (92.1 kg)  02/17/22 196 lb 9.6 oz (89.2 kg)  11/11/21 199 lb (90.3 kg)     Exam: General: Pt appears well and is in NAD  Lungs: Clear with good BS bilat   Heart: RRR   Abdomen:  soft, nontender, without masses or organomegaly palpable  Extremities: No pretibial edema.   Neuro: MS is good with appropriate affect, pt is alert and Ox3    DM foot exam: 11/07/2022 The skin of the feet is intact without sores or ulcerations. The pedal pulses are 2+ on right and 2+ on left. The sensation is intact to a screening 5.07, 10 gram monofilament bilaterally       DATA REVIEWED:  Lab Results  Component Value Date   HGBA1C 6.4 (A) 11/07/2022   HGBA1C 6.4 (A) 02/17/2022   HGBA1C 8.2 (A) 11/11/2021    Latest Reference Range & Units 11/07/22 09:07 11/07/22 09:15  Total CHOL/HDL Ratio  4   Cholesterol 0 - 200 mg/dL 276 (H)   HDL Cholesterol >39.00 mg/dL 73.60   LDL (calc) 0 - 99 mg/dL 178 (H)   MICROALB/CREAT RATIO 0.0 - 30.0 mg/g  0.6  NonHDL  202.88   Triglycerides 0.0 - 149.0 mg/dL 123.0   VLDL 0.0 - 40.0 mg/dL 24.6     Latest Reference Range & Units 11/07/22 09:07  VLDL 0.0 - 40.0 mg/dL 24.6  TSH 0.35 - 5.50 uIU/mL 1.28  T4,Free(Direct) 0.60 - 1.60 ng/dL 0.82    Latest Reference Range & Units 11/07/22 09:15  Creatinine,U mg/dL 206.5  Microalb, Ur 0.0 - 1.9 mg/dL 1.3  MICROALB/CREAT RATIO 0.0 - 30.0 mg/g 0.6       ASSESSMENT / PLAN / RECOMMENDATIONS:   1) Type 2 Diabetes Mellitus, Optimally , Without complications - Most recent A1c of 6.4 %. Goal A1c < 7.0 %.    -A1c remains at goal with lifestyle modifications -No changes at this time  2) Diabetic complications:  Eye: Does not have known diabetic retinopathy.  Neuro/ Feet: Does not have known diabetic peripheral neuropathy .  Renal: Patient does not have known baseline CKD.   3) Dyslipidemia:  -She was on atorvastatin which was discontinued by oncology due to interaction with chemotherapy in 2023 -She will  remain off this until cleared by oncology -LDL has worsened , in the meantime she will be encouraged to continue with low-fat diet     F/U in 6 months   Signed electronically by: Mack Guise, MD  [facility]  [facility] [address] [place], [place] [address] Phone: [phone] FAX: [phone]   CC: Antony Contras, MD [place] [address] Phone: [phone]  Fax: [phone]  Return to Endocrinology clinic as below: No future appointments.

## 2023-02-04 NOTE — Progress Notes (Unsigned)
Patient has a right 5th toe fx and needed a rigid plate insole to ambulate given the camwalker provided was uncomfortable. Pt will return the camwalker to the urgent care for a credit.  Agree with plan to place her in a more comfortable foot support while this heals.

## 2023-02-05 ENCOUNTER — Ambulatory Visit (INDEPENDENT_AMBULATORY_CARE_PROVIDER_SITE_OTHER): Payer: 59 | Admitting: Sports Medicine

## 2023-02-05 DIAGNOSIS — S92919A Unspecified fracture of unspecified toe(s), initial encounter for closed fracture: Secondary | ICD-10-CM | POA: Insufficient documentation

## 2023-02-05 DIAGNOSIS — S92504D Nondisplaced unspecified fracture of right lesser toe(s), subsequent encounter for fracture with routine healing: Secondary | ICD-10-CM

## 2023-02-19 ENCOUNTER — Other Ambulatory Visit: Payer: Self-pay | Admitting: Internal Medicine

## 2023-04-28 ENCOUNTER — Ambulatory Visit (INDEPENDENT_AMBULATORY_CARE_PROVIDER_SITE_OTHER): Payer: 59 | Admitting: Internal Medicine

## 2023-04-28 ENCOUNTER — Encounter: Payer: Self-pay | Admitting: Internal Medicine

## 2023-04-28 VITALS — BP 124/80 | HR 70 | Ht 61.0 in | Wt 204.0 lb

## 2023-04-28 DIAGNOSIS — E559 Vitamin D deficiency, unspecified: Secondary | ICD-10-CM

## 2023-04-28 DIAGNOSIS — E119 Type 2 diabetes mellitus without complications: Secondary | ICD-10-CM

## 2023-04-28 DIAGNOSIS — E785 Hyperlipidemia, unspecified: Secondary | ICD-10-CM

## 2023-04-28 LAB — LIPID PANEL
Cholesterol: 199 mg/dL (ref 0–200)
HDL: 65.3 mg/dL (ref >39.00)
LDL Cholesterol: 115 mg/dL — ABNORMAL HIGH (ref 0–99)
NonHDL: 133.73
Total CHOL/HDL Ratio: 3
Triglycerides: 95 mg/dL (ref 0.0–149.0)
VLDL: 19 mg/dL (ref 0.0–40.0)

## 2023-04-28 LAB — POCT GLYCOSYLATED HEMOGLOBIN (HGB A1C): Hemoglobin A1C: 6.2 % — AB (ref 4.0–5.6)

## 2023-04-28 LAB — POCT GLUCOSE (DEVICE FOR HOME USE): POC Glucose: 130 mg/dl — AB (ref 70–99)

## 2023-04-28 LAB — VITAMIN D 25 HYDROXY (VIT D DEFICIENCY, FRACTURES): VITD: 18.03 ng/mL — ABNORMAL LOW (ref 30.00–100.00)

## 2023-04-28 LAB — TSH: TSH: 1.06 u[IU]/mL (ref 0.35–5.50)

## 2023-04-28 MED ORDER — ONETOUCH VERIO VI STRP: 1.0000 | ORAL_STRIP | Freq: Every day | 3 refills | Status: AC

## 2023-04-28 MED ORDER — ONETOUCH VERIO W/DEVICE KIT: 1.0000 | PACK | Freq: Every day | 0 refills | Status: AC

## 2023-04-28 NOTE — Progress Notes (Unsigned)
Name: [name]  Age/ Sex: 57 y.o., female   MRN/ DOB: [id number], 1965/11/04     PCP: Tally Joe, MD   Reason for Endocrinology Evaluation: Type 2 Diabetes Mellitus  Initial Endocrine Consultative Visit: 08/24/18    PATIENT IDENTIFIER: [name] is a 57 y.o. female with a past medical history of T2DM, T-cell lymphoma (Dx 08/2021). The patient has followed with Endocrinology clinic since 08/24/18 for consultative assistance with management of her diabetes.  DIABETIC HISTORY:   [name] was diagnosed with T2DM in October, 2019 with an A1c of 7.2% during routine Gyn appointment.Patient opted for lifestyle changes for management rather than start on any anti glycemic agents.. Her hemoglobin A1c was 7.2 % in 07/2018    Atorvastatin started in 12/2019  She is an occupational nurse  SUBJECTIVE:    Previous Visit (11/07/2022):  A1c was 6.4%     Today (04/28/2023): [name] is here for a  month follow up on her diabetes management. She checks her blood sugars occasionally .     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 She received chemo  with evidence of recurrence of the left cervical node 01/2023, s/p excision She is s/p autologous stem cell reinfusion 08/11/2022  Entertaining allogenic transplant, pending PET scan this month  She is s/p right fifth toe fracture 01/2023 needing a rigid plate insole  She is on MVI  She is recovering from URI - viral  Patient also attributes some of her symptoms to possible allergies due to sneezing   HOME ENDOCRINE REGIMEN:  Atorvastatin 20 mg daily      METER DOWNLOAD SUMMARY: Did not bring     DIABETIC COMPLICATIONS: Microvascular complications:  Denies: neuropathy, retinopathy Last eye exam: Completed 03/12/2023 Macrovascular complications:    Denies: CAD, PVD, CVA   HISTORY:  Past Medical History:  Past Medical History:   Diagnosis Date   Diabetes mellitus without complication (HCC)    used to have elevated A1C   Hyperlipidemia    Past Surgical History:  Past Surgical History:  Procedure Laterality Date   CESAREAN SECTION     CHOLECYSTECTOMY     Social History:  reports that she has never smoked. She has never used smokeless tobacco. She reports that she does not drink alcohol and does not use drugs. Family History:  Family History  Problem Relation Age of Onset   Heart disease Mother    Hypertension Mother    Diabetes Father    Hypertension Father      HOME MEDICATIONS: Allergies as of 04/28/2023   No Known Allergies      Medication List        Accurate as of April 28, 2023  8:13 AM. If you have any questions, ask your nurse or doctor.          STOP taking these medications    ascorbic acid 100 MG tablet Commonly known as: VITAMIN C Stopped by: Scarlette Shorts, MD   sulfamethoxazole-trimethoprim 800-160 MG tablet Commonly known as: BACTRIM DS Stopped by: Scarlette Shorts, MD       TAKE these medications    acyclovir 400 MG tablet Commonly known as: ZOVIRAX Take 800 mg by mouth 2 (two) times daily.   atorvastatin 20 MG tablet Commonly known as: LIPITOR TAKE ONE TABLET BY MOUTH ONE TIME DAILY   folic acid 1 MG tablet Commonly known as: FOLVITE Take 1  mg by mouth daily.   MULTIVITAMIN ADULT PO Take 1 tablet by mouth daily at 6 (six) AM.   oxyCODONE 5 MG immediate release tablet Commonly known as: Oxy IR/ROXICODONE Take 5 mg by mouth every 6 (six) hours as needed.         OBJECTIVE:   Vital Signs: BP 124/80 (BP Location: Left Arm, Patient Position: Sitting, Cuff Size: Large)   Pulse 70   Ht 5\' 1"  (1.549 m)   Wt 204 lb (92.5 kg)   SpO2 99%   BMI 38.55 kg/m   Wt Readings from Last 3 Encounters:  04/28/23 204 lb (92.5 kg)  11/07/22 203 lb (92.1 kg)  02/17/22 196 lb 9.6 oz (89.2 kg)     Exam: General: Pt appears well and is in NAD Left  anterior neck keloid noted, no lymphadenopathy palpated  Lungs: Clear with good BS bilat   Heart: RRR   Abdomen:  soft, nontender  Extremities: No pretibial edema.   Neuro: MS is good with appropriate affect, pt is alert and Ox3    DM foot exam: 04/28/2023 The skin of the feet is intact without sores or ulcerations. The pedal pulses are 2+ on right and 2+ on left. The sensation is intact to a screening 5.07, 10 gram monofilament bilaterally       DATA REVIEWED:  Lab Results  Component Value Date   HGBA1C 6.4 (A) 11/07/2022   HGBA1C 6.4 (A) 02/17/2022   HGBA1C 8.2 (A) 11/11/2021      Latest Reference Range & Units 11/07/22 09:15  Creatinine,U mg/dL 161.0  Microalb, Ur 0.0 - 1.9 mg/dL 1.3  MICROALB/CREAT RATIO 0.0 - 30.0 mg/g 0.6    03/11/2023  Na 138 K 4 BUN 13 Cr 0.65 GFR >90 ALP 81 AST 20 ALT 20 CA 9.8  ASSESSMENT / PLAN / RECOMMENDATIONS:   1) Type 2 Diabetes Mellitus, Optimally , Without complications - Most recent A1c of 6.2 %. Goal A1c < 7.0 %.    -A1c remains at goal with lifestyle modifications   2) Diabetic complications:  Eye: Does not have known diabetic retinopathy.  Neuro/ Feet: Does not have known diabetic peripheral neuropathy .  Renal: Patient does not have known baseline CKD.   3) Dyslipidemia:  -Lipid panel****    F/U in 6 months   Signed electronically by: Lyndle Herrlich, MD  [facility]  [facility] [address] [place], [place] [address] Phone: [phone] FAX: [phone]   CC: Tally Joe, MD [address] [place] [address] Phone: [phone]  Fax: [phone]  Return to Endocrinology clinic as below: Future Appointments  Date Time Provider Department Center  05/29/2023  8:00 AM Croitoru, Rachelle Hora, MD [facility] None

## 2023-04-29 MED ORDER — VITAMIN D (ERGOCALCIFEROL) 1.25 MG (50000 UNIT) PO CAPS
50000.0000 [IU] | ORAL_CAPSULE | ORAL | 3 refills | Status: DC
Start: 2023-04-29 — End: 2023-11-02

## 2023-05-12 ENCOUNTER — Ambulatory Visit: Payer: 59 | Admitting: Internal Medicine

## 2023-05-19 ENCOUNTER — Ambulatory Visit: Payer: 59 | Admitting: Internal Medicine

## 2023-05-29 ENCOUNTER — Ambulatory Visit: Payer: 59 | Attending: Cardiovascular Disease | Admitting: Cardiovascular Disease

## 2023-05-29 ENCOUNTER — Encounter: Payer: Self-pay | Admitting: Cardiovascular Disease

## 2023-05-29 ENCOUNTER — Other Ambulatory Visit: Payer: Self-pay

## 2023-05-29 VITALS — BP 104/78 | HR 59 | Wt 208.0 lb

## 2023-05-29 DIAGNOSIS — E785 Hyperlipidemia, unspecified: Secondary | ICD-10-CM | POA: Diagnosis not present

## 2023-05-29 DIAGNOSIS — Z87898 Personal history of other specified conditions: Secondary | ICD-10-CM

## 2023-05-29 DIAGNOSIS — E119 Type 2 diabetes mellitus without complications: Secondary | ICD-10-CM

## 2023-05-29 NOTE — Patient Instructions (Signed)
 Medication Instructions:  Your physician recommends that you continue on your current medications as directed. Please refer to the Current Medication list given to you today.  *If you need a refill on your cardiac medications before your next appointment, please call your pharmacy*  Follow-Up: At [facility], you and your health needs are our priority.  As part of our continuing mission to provide you with exceptional heart care, we have created designated Provider Care Teams.  These Care Teams include your primary Cardiologist (physician) and Advanced Practice Providers (APPs -  Physician Assistants and Nurse Practitioners) who all work together to provide you with the care you need, when you need it.  Your next appointment:   As needed with Dr. Royann Shivers

## 2023-05-29 NOTE — Progress Notes (Signed)
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    She has had a lot of health problems since her last visit here.  She was diagnosed with T-cell lymphoma in October 2022 after she developed submandibular lymphadenopathy on the right side.  She received treatment with anthracycline based chemotherapy for 6 cycles and appeared to have a good response, but subsequently had a relapse in 2023. She had cervical, axillary and inguinal involvement but did not have bone marrow involvement.  She underwent a auto stem cell transplant in October 2023.  On a PET scan in April 2024 she was found to have a single hotspot in the left cervical area and she underwent surgical removal of the index lymph node and surrounding lymph nodes.  She is currently felt to be in remission again.  She is not actively receiving therapy.  During the period of intensive treatment she had to stop her statin and her LDL cholesterol increased 409.  She has now been back on statin therapy (atorvastatin 20 mg 4 days a week ) since January.  Lipid profile performed 04/28/2023 shows an LDL cholesterol level of 115.  The best LDL she has had on therapy in the last several years was 98 in 2022.  At that time she was also able to pay more attention to her diet and exercise.  She has had some vague muscular side effects when taking the atorvastatin daily.  Her most recent hemoglobin A1c was 6.2% last month.  Following anthracycline chemotherapy her LVEF was 60 to 65%, but the LV global longitudinal strain  may have been slightly diminished at -15.7%.  Her husband health  had a PE (has prothrombin gene mutation) and her father  has ESRD, she takes him to H).  The patient specifically denies any chest pain at rest exertion, dyspnea at rest or with exertion, orthopnea, paroxysmal nocturnal dyspnea, syncope, palpitations, focal neurological deficits, intermittent claudication, lower extremity edema, unexplained weight gain, cough, hemoptysis or wheezing.    Past Medical History:  Diagnosis Date   Diabetes mellitus without complication (HCC)    used to have elevated A1C   Hyperlipidemia     Past Surgical History:  Procedure Laterality Date   CESAREAN SECTION     CHOLECYSTECTOMY      Current Medications: Current Meds  Medication Sig   acyclovir (ZOVIRAX) 400 MG tablet Take 800 mg by mouth 2 (two) times daily.   atorvastatin (LIPITOR) 20 MG tablet TAKE ONE TABLET BY MOUTH ONE TIME DAILY   folic acid (FOLVITE) 1 MG tablet Take 1 mg by mouth daily.   Multiple Vitamin (MULTIVITAMIN ADULT PO) Take 1 tablet by mouth daily at 6 (six) AM.   Vitamin D, Ergocalciferol, (DRISDOL) 1.25 MG (50000 UNIT) CAPS capsule Take 1 capsule (50,000 Units total) by mouth every 7 (seven) days.     Allergies:   Patient has no known allergies.   Social History   Socioeconomic History   Marital status: Married    Spouse name:  Not on file   Number of children: Not on file   Years of education: Not on file   Highest education level: Not on file  Occupational History   Not on file  Tobacco Use   Smoking status: Never   Smokeless tobacco: Never  Vaping Use   Vaping status: Never Used  Substance and Sexual Activity   Alcohol use: No   Drug use: No   Sexual activity: Not on file  Other Topics Concern   Not on file  Social History Narrative   Not on file   Social Determinants of Health   Financial Resource Strain: Not on file  Food Insecurity: Low Risk  (05/14/2023)   Received from [facility]    Food vital sign    Within the past 12 months, you worried that your food would run out before you got money to buy more: Never true    Within the past 12 months, the food you bought just didn't last and you didn't have money to get more. : Never true  Transportation Needs: Not on file (05/14/2023)  Physical Activity: Not on file  Stress: Not on file  Social Connections: Not on file     Family History: The patient's family history includes Diabetes in her father; Heart disease in her mother; Hypertension in her father and mother.  ROS:   Please see the history of present illness.     All other systems reviewed and are negative.  EKGs/Labs/Other Studies Reviewed:    The following studies were reviewed today: ECHO 07/18/2022  SUMMARY  Compared to prior study dated 08-16-2021.   Normal LV size, wall thickness, wall motion and systolic function with  ejection fraction 60-65%  Mildly reduced LV Global L Strain =-15.7%. Difficult to compare with  prior study due to different machines and softwares, but probably  reduced compared to prior.  Normal left ventricular diastolic function and left atrial pressure.  The right ventricle is normal in size and function.  The atria are normal in size.  There is no significant valvular stenosis or regurgitation.  There was insufficient TR detected to calculate RV systolic pressure.  The IVC is normal in size with an inspiratory collapse of greater then  50%, suggesting normal right atrial pressure.   Difficulty to compare with the prior study due to different image  quality.   Event monitor 02/28/2020  Normal sinus rhythm with normal circadian variation. Patient-triggered recordings for palpitations coincide with very rare PVCs.   Normal event monitor. Symptoms appear to be related to premature ventricular contractions, which occur very rarely.     EKG:  EKG is ordered today.  Shows normal sinus rhythm, normal tracing.    Recent  Labs: 04/28/2023: TSH 1.06  Recent Lipid Panel    Component Value Date/Time   CHOL 199 04/28/2023 0846   TRIG 95.0 04/28/2023 0846   HDL 65.30 04/28/2023 0846   CHOLHDL 3 04/28/2023 0846   VLDL 19.0 04/28/2023 0846   LDLCALC 115 (H) 04/28/2023 0846    Physical Exam:    VS:  BP 104/78 (BP Location: Left Arm, Patient Position: Sitting, Cuff Size: Large)   Pulse (!) 59   Wt 208 lb (94.3 kg)   SpO2 96%   BMI 39.30 kg/m     Wt Readings from Last 3 Encounters:  05/29/23 208 lb (94.3 kg)  04/28/23 204 lb (92.5 kg)  11/07/22 203 lb (92.1 kg)      General: Alert, oriented x3, no distress,  severely obese.  Healthy subclavian Port-A-Cath site. Head: no evidence of trauma, PERRL, EOMI, no exophtalmos or lid lag, no myxedema, no xanthelasma; normal ears, nose and oropharynx Neck: normal jugular venous pulsations and no hepatojugular reflux; brisk carotid pulses without delay and no carotid bruits Chest: clear to auscultation, no signs of consolidation by percussion or palpation, normal fremitus, symmetrical and full respiratory excursions Cardiovascular: normal position and quality of the apical impulse, regular rhythm, normal first and second heart sounds, no murmurs, rubs or gallops Abdomen: no tenderness or distention, no masses by palpation, no abnormal pulsatility or arterial bruits, normal bowel sounds, no hepatosplenomegaly Extremities: no clubbing, cyanosis or edema; 2+ radial, ulnar and brachial pulses bilaterally; 2+ right femoral, posterior tibial and dorsalis pedis pulses; 2+ left femoral, posterior tibial and dorsalis pedis pulses; no subclavian or femoral bruits Neurological: grossly nonfocal Psych: Normal mood and affect   ASSESSMENT:    1. History of syncope   2. Type 2 diabetes mellitus without complication, without long-term current use of insulin (HCC)   3. Dyslipidemia   4. Severe obesity (BMI 35.0-39.9) with comorbidity (HCC)    PLAN:    In order of problems  listed above:  Syncope: She is only had 1 event.  No evidence of structural heart disease by echo and no arrhythmia on rhythm monitor.  No recurrence in the last 3 years. DM: Remains in prediabetes range without medications.  Weight loss and exercise could well bring her levels back to normal. HLP: Despite taking atorvastatin her LDL cholesterol remains out of range.  There was evidence of "modest aortic atherosclerosis" on her CTs due to her lymphoma treatment.  Recommend LDL cholesterol less than 413.  Has some intolerance to daily atorvastatin.  She would like significant medical benefit profile of exercise and improve diet, now that she is done with treatment for lymphoma.  If her lifestyle changes do not bring her in range, I would recommend maybe switching to rosuvastatin 20 mg 3-4 days a week which will be more effective, work better when taken intermittently and may not give her the same muscular side effects. Severe obesity: Encouraged her to continue her efforts at weight loss.  Might want to consider GLP-1 agonist [facility]) or even bariatric surgery.  Medication Adjustments/Labs and Tests Ordered: Current medicines are reviewed at length with the patient today.  Concerns regarding medicines are outlined above.  Orders Placed This Encounter  Procedures   EKG 12-Lead   No orders of the defined types were placed in this encounter.   Patient Instructions  Medication Instructions:  Your physician recommends that you continue on your current medications as directed. Please refer to the Current Medication list given to you today.  *If you need a refill on your cardiac medications before your next appointment, please call your pharmacy*  Follow-Up: At [facility], you and your health needs are our priority.  As part of our continuing mission to provide you with exceptional heart care, we have created designated Provider Care Teams.  These Care Teams include your primary  Cardiologist (physician) and Advanced Practice Providers (APPs -  Physician Assistants and Nurse Practitioners) who all work together to provide you with the care you need, when you need it.  Your next appointment:   As needed with Dr. Royann Shivers   Signed, Thurmon Fair, MD  05/29/2023 8:43 AM    [facility]

## 2023-10-30 ENCOUNTER — Ambulatory Visit: Payer: 59 | Admitting: Internal Medicine

## 2023-10-30 ENCOUNTER — Encounter: Payer: Self-pay | Admitting: Internal Medicine

## 2023-10-30 VITALS — BP 124/76 | HR 73 | Ht 61.0 in | Wt 212.0 lb

## 2023-10-30 DIAGNOSIS — E119 Type 2 diabetes mellitus without complications: Secondary | ICD-10-CM | POA: Diagnosis not present

## 2023-10-30 DIAGNOSIS — E785 Hyperlipidemia, unspecified: Secondary | ICD-10-CM

## 2023-10-30 LAB — POCT GLYCOSYLATED HEMOGLOBIN (HGB A1C): Hemoglobin A1C: 6.9 % — AB (ref 4.0–5.6)

## 2023-10-30 LAB — POCT GLUCOSE (DEVICE FOR HOME USE): POC Glucose: 149 mg/dL — AB (ref 70–99)

## 2023-10-30 NOTE — Progress Notes (Signed)
 Name: [name]  Age/ Sex: 58 y.o., female   MRN/ DOB: [id number], November 10, 1965     PCP: Seabron Lenis, MD   Reason for Endocrinology Evaluation: Type 2 Diabetes Mellitus  Initial Endocrine Consultative Visit: 08/24/18    PATIENT IDENTIFIER: [name] is a 58 y.o. female with a past medical history of T2DM, T-cell lymphoma (Dx 08/2021). The patient has followed with Endocrinology clinic since 08/24/18 for consultative assistance with management of her diabetes.  DIABETIC HISTORY:   [name] was diagnosed with T2DM in October, 2019 with an A1c of 7.2% during routine Gyn appointment.Patient opted for lifestyle changes for management rather than start on any anti glycemic agents.. Her hemoglobin A1c was 7.2 % in 07/2018    Atorvastatin  started in 12/2019  She is an occupational nurse  SUBJECTIVE:    Previous Visit (04/28/2023):  A1c was 6.2%     Today (10/30/2023): [name] is here for a  month follow up on her diabetes management. She checks her blood sugars every other day  .    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 She received chemo  with evidence of recurrence of the left cervical node 01/2023, s/p excision She is s/p autologous stem cell reinfusion 08/11/2022  PET scan 08/2023 showed no FDG avid disease  Husband with valve regurgitation  Denies nausea or vomiting  Denies constipation or diarrhea  Stable aches and pains   HOME ENDOCRINE REGIMEN:  Atorvastatin  20 mg daily  Ergocalciferol  50,000 weekly    METER DOWNLOAD SUMMARY:   142-153 mg/dL     DIABETIC COMPLICATIONS: Microvascular complications:  Denies: neuropathy, retinopathy Last eye exam: Completed 03/12/2023 Macrovascular complications:    Denies: CAD, PVD, CVA   HISTORY:  Past Medical History:  Past Medical History:  Diagnosis Date   Diabetes mellitus without complication (HCC)    used to  have elevated A1C   Hyperlipidemia    Past Surgical History:  Past Surgical History:  Procedure Laterality Date   CESAREAN SECTION     CHOLECYSTECTOMY     Social History:  reports that she has never smoked. She has never used smokeless tobacco. She reports that she does not drink alcohol and does not use drugs. Family History:  Family History  Problem Relation Age of Onset   Heart disease Mother    Hypertension Mother    Diabetes Father    Hypertension Father      HOME MEDICATIONS: Allergies as of 10/30/2023   No Known Allergies      Medication List        Accurate as of October 30, 2023  1:21 PM. If you have any questions, ask your nurse or doctor.          acyclovir 400 MG tablet Commonly known as: ZOVIRAX Take 800 mg by mouth 2 (two) times daily.   atorvastatin  20 MG tablet Commonly known as: LIPITOR TAKE ONE TABLET BY MOUTH ONE TIME DAILY   folic acid 1 MG tablet Commonly known as: FOLVITE Take 1 mg by mouth daily.   MULTIVITAMIN ADULT PO Take 1 tablet by mouth daily at 6 (six) AM.   OneTouch Verio test strip Generic drug: glucose blood 1 each by Other route daily in the afternoon. Use as instructed   OneTouch Verio w/Device Kit 1 Device by Does not apply route daily in the afternoon.   oxyCODONE 5 MG immediate release tablet Commonly known  as: Oxy IR/ROXICODONE Take 5 mg by mouth every 6 (six) hours as needed.   Vitamin D  (Ergocalciferol ) 1.25 MG (50000 UNIT) Caps capsule Commonly known as: DRISDOL  Take 1 capsule (50,000 Units total) by mouth every 7 (seven) days.         OBJECTIVE:   Vital Signs: BP 124/76 (BP Location: Left Arm, Patient Position: Sitting, Cuff Size: Normal)   Pulse 73   Ht 5' 1 (1.549 m)   Wt 212 lb (96.2 kg)   SpO2 98%   BMI 40.06 kg/m   Wt Readings from Last 3 Encounters:  10/30/23 212 lb (96.2 kg)  05/29/23 208 lb (94.3 kg)  04/28/23 204 lb (92.5 kg)     Exam: General: Pt appears well and is in NAD   Lungs: Clear with good BS bilat   Heart: RRR   Abdomen:  soft, nontender  Extremities: No pretibial edema.   Neuro: MS is good with appropriate affect, pt is alert and Ox3    DM foot exam: 10/30/2023 The skin of the feet is intact without sores or ulcerations. Pt with a round laceration at the right heel  The pedal pulses are 2+ on right and 2+ on left. The sensation is intact to a screening 5.07, 10 gram monofilament bilaterally       DATA REVIEWED:  Lab Results  Component Value Date   HGBA1C 6.9 (A) 10/30/2023   HGBA1C 6.2 (A) 04/28/2023   HGBA1C 6.4 (A) 11/07/2022    Latest Reference Range & Units 10/30/23 08:14  Total CHOL/HDL Ratio <5.0 (calc) 2.6  Cholesterol <200 mg/dL 796 (H)  HDL Cholesterol > OR = 50 mg/dL 79  LDL Cholesterol (Calc) mg/dL (calc) 892 (H)  Non-HDL Cholesterol (Calc) <130 mg/dL (calc) 875  Triglycerides <150 mg/dL 80  Vitamin D , 25-Hydroxy 30 - 100 ng/mL 70    09/03/2023  Na 136 K 3.4 Gluc 70 BUN 12 Cr 0.60 GFR >90 ALP 81 AST 20 ALT 20 CA 9.8   Old records , labs and images have been reviewed.   ASSESSMENT / PLAN / RECOMMENDATIONS:   1) Type 2 Diabetes Mellitus, Optimally , Without complications - Most recent A1c of 6.9 %. Goal A1c < 7.0 %.    -A1c remains at goal but it has been trending up -Encouraged to continue with lifestyle changes including low-carb diet and exercise -Patient would like to avoid medications   2) Diabetic complications:  Eye: Does not have known diabetic retinopathy.  Neuro/ Feet: Does not have known diabetic peripheral neuropathy .  Renal: Patient does not have known baseline CKD.   3) Dyslipidemia:  -Lipid panel continues to show LDL above goal but continues to trend down -Will encourage low-fat diet  Medication Continue atorvastatin  20 mg daily   4) Vitamin D  deficiency:  -Normalized   Medication Continue ergocalciferol  50,000 IU weekly  F/U in 6 months   Signed electronically  by: Stefano Redgie Butts, MD  [facility]  [facility] [address] [place] [place], [place] [address] Phone: [phone] FAX: [phone]   CC: Seabron Lenis, MD [address] [place] [address] Phone: [phone]  Fax: [phone]  Return to Endocrinology clinic as below: Future Appointments  Date Time Provider Department Center  03/11/2024  7:30 AM Abbagayle Zaragoza, Donell Redgie, MD [facility] None

## 2023-10-31 LAB — LIPID PANEL
Cholesterol: 203 mg/dL — ABNORMAL HIGH (ref <200)
HDL: 79 mg/dL (ref >=50)
LDL Cholesterol (Calc): 107 mg/dL — ABNORMAL HIGH
Non-HDL Cholesterol (Calc): 124 mg/dL (ref <130)
Total CHOL/HDL Ratio: 2.6 (calc) (ref <5.0)
Triglycerides: 80 mg/dL (ref <150)

## 2023-10-31 LAB — VITAMIN D 25 HYDROXY (VIT D DEFICIENCY, FRACTURES): Vit D, 25-Hydroxy: 70 ng/mL (ref 30–100)

## 2023-11-02 MED ORDER — VITAMIN D (ERGOCALCIFEROL) 1.25 MG (50000 UNIT) PO CAPS: 50000.0000 [IU] | ORAL_CAPSULE | ORAL | 3 refills | Status: DC

## 2024-03-09 ENCOUNTER — Other Ambulatory Visit: Payer: Self-pay | Admitting: Internal Medicine

## 2024-03-10 NOTE — Progress Notes (Signed)
 Name: [name]  Age/ Sex: 58 y.o., female   MRN/ DOB: [id number], Jan 23, 1966     PCP: Rae Bugler, MD   Reason for Endocrinology Evaluation: Type 2 Diabetes Mellitus  Initial Endocrine Consultative Visit: 08/24/18    PATIENT IDENTIFIER: [name] is a 58 y.o. female with a past medical history of T2DM, T-cell lymphoma (Dx 08/2021). The patient has followed with Endocrinology clinic since 08/24/18 for consultative assistance with management of her diabetes.  DIABETIC HISTORY:   [name] was diagnosed with T2DM in October, 2019 with an A1c of 7.2% during routine Gyn appointment.Patient opted for lifestyle changes for management rather than start on any anti glycemic agents.. Her hemoglobin A1c was 7.2 % in 07/2018    Atorvastatin  started in 12/2019  She is an occupational nurse  SUBJECTIVE:    Previous Visit (10/30/2023):  A1c was 6.9%     Today (03/11/2024): [name] is here for a  month follow up on her diabetes management. She checks her blood sugars 2-3x weekly.   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 She received chemo  with evidence of recurrence of the left cervical node 01/2023, s/p excision She is s/p autologous stem cell reinfusion 08/11/2022  PET scan 08/2023 showed no FDG avid disease PET/CT 03/07/2024: Showed new FDG avid nodule in the anterior superficial lobe of the left parotid gland raising concern for lymph vomitus recurrence, focal FDG uptake in the right tracheoesophageal groove without definite anatomic correlate, may be secondary to physiologic uptake in the right clinical arytenoid musculature  Saw ENT yesterday , scheduled for CT of head and neck next week , entertaining Bx of left parotid gland . She continues with left pre-auricular abnormal sensation   Weight has been stable  Denies nausea or vomiting  Denies constipation or diarrhea    HOME  ENDOCRINE REGIMEN:  Atorvastatin  20 mg daily  Ergocalciferol  50,000 weekly    METER DOWNLOAD SUMMARY:   130-150 mg/dL     DIABETIC COMPLICATIONS: Microvascular complications:  Denies: neuropathy, retinopathy Last eye exam: Completed 02/08/2024 Macrovascular complications:    Denies: CAD, PVD, CVA   HISTORY:  Past Medical History:  Past Medical History:  Diagnosis Date   Diabetes mellitus without complication (HCC)    used to have elevated A1C   Hyperlipidemia    Past Surgical History:  Past Surgical History:  Procedure Laterality Date   CESAREAN SECTION     CHOLECYSTECTOMY     Social History:  reports that she has never smoked. She has never used smokeless tobacco. She reports that she does not drink alcohol and does not use drugs. Family History:  Family History  Problem Relation Age of Onset   Heart disease Mother    Hypertension Mother    Diabetes Father    Hypertension Father      HOME MEDICATIONS: Allergies as of 03/11/2024   No Known Allergies      Medication List        Accurate as of Mar 11, 2024  7:42 AM. If you have any questions, ask your nurse or doctor.          acyclovir 400 MG tablet Commonly known as: ZOVIRAX Take 800 mg by mouth 2 (two) times daily.   atorvastatin  20 MG tablet Commonly known as: LIPITOR TAKE ONE TABLET BY MOUTH ONE TIME DAILY   folic acid 1 MG tablet Commonly known as: FOLVITE Take  1 mg by mouth daily.   MULTIVITAMIN ADULT PO Take 1 tablet by mouth daily at 6 (six) AM.   OneTouch Verio test strip Generic drug: glucose blood 1 each by Other route daily in the afternoon. Use as instructed   OneTouch Verio w/Device Kit 1 Device by Does not apply route daily in the afternoon.   oxyCODONE 5 MG immediate release tablet Commonly known as: Oxy IR/ROXICODONE Take 5 mg by mouth every 6 (six) hours as needed.   Vitamin D  (Ergocalciferol ) 1.25 MG (50000 UNIT) Caps capsule Commonly known as: DRISDOL  Take 1  capsule (50,000 Units total) by mouth every 7 (seven) days.         OBJECTIVE:   Vital Signs: BP 124/72 (BP Location: Left Arm, Patient Position: Sitting, Cuff Size: Normal)   Pulse 67   Ht 5\' 1"  (1.549 m)   Wt 212 lb (96.2 kg)   SpO2 96%   BMI 40.06 kg/m   Wt Readings from Last 3 Encounters:  03/11/24 212 lb (96.2 kg)  10/30/23 212 lb (96.2 kg)  05/29/23 208 lb (94.3 kg)     Exam: General: Pt appears well and is in NAD  Lungs: Clear with good BS bilat   Heart: RRR   Extremities: No pretibial edema.   Neuro: MS is good with appropriate affect, pt is alert and Ox3    DM foot exam: 10/30/2023 The skin of the feet is intact without sores or ulcerations. Pt with a round laceration at the right heel  The pedal pulses are 2+ on right and 2+ on left. The sensation is intact to a screening 5.07, 10 gram monofilament bilaterally       DATA REVIEWED:  Lab Results  Component Value Date   HGBA1C 6.9 (A) 03/11/2024   HGBA1C 6.9 (A) 10/30/2023   HGBA1C 6.2 (A) 04/28/2023    Latest Reference Range & Units 10/30/23 08:14  Total CHOL/HDL Ratio <5.0 (calc) 2.6  Cholesterol <200 mg/dL 811 (H)  HDL Cholesterol > OR = 50 mg/dL 79  LDL Cholesterol (Calc) mg/dL (calc) 914 (H)  Non-HDL Cholesterol (Calc) <130 mg/dL (calc) 782  Triglycerides <150 mg/dL 80  Vitamin D , 25-Hydroxy 30 - 100 ng/mL 70   03/07/2024 @ [facility]   Na 140 K 3.7 Gluc 146 BUN 13 Cr 0.58 GFR >90  Old records , labs and images have been reviewed.   ASSESSMENT / PLAN / RECOMMENDATIONS:   1) Type 2 Diabetes Mellitus, Optimally , Without complications - Most recent A1c of 6.9 %. Goal A1c < 7.0 %.    -A1c remains at goal  -Patient would like to avoid medications   2) Diabetic complications:  Eye: Does not have known diabetic retinopathy.  Neuro/ Feet: Does not have known diabetic peripheral neuropathy .  Renal: Patient does not have known baseline CKD.   3) Dyslipidemia:  -Lipid panel  continues to show LDL above goal but continues to trend down -Patient on low-fat diet - We opted not to check lipid panel at this time, until the left parotid hyperactivity has been addressed  Medication Continue atorvastatin  20 mg daily   4) Vitamin D  deficiency:  -Normalized, but I have asked her to decrease ergocalciferol  as below   Medication Decrease ergocalciferol  50,000 IU every other week  F/U in 6 months  I spent 25 minutes preparing to see the patient by review of recent labs, imaging and procedures, obtaining and reviewing separately obtained history, communicating with the patient, ordering medications, tests or procedures,  and documenting clinical information in the EHR including the differential Dx, treatment, and any further evaluation and other management   Signed electronically by: Natale Bail, MD  [facility] Endocrinology  [facility] [address], [address] [place], [place] [address] Phone: [phone] FAX: [phone]   CC: Rae Bugler, MD [address] [place] [address] Phone: [phone]  Fax: [phone]  Return to Endocrinology clinic as below: No future appointments.

## 2024-03-11 ENCOUNTER — Ambulatory Visit: Payer: 59 | Admitting: Internal Medicine

## 2024-03-11 ENCOUNTER — Encounter: Payer: Self-pay | Admitting: Internal Medicine

## 2024-03-11 VITALS — BP 124/72 | HR 67 | Ht 61.0 in | Wt 212.0 lb

## 2024-03-11 DIAGNOSIS — E559 Vitamin D deficiency, unspecified: Secondary | ICD-10-CM | POA: Diagnosis not present

## 2024-03-11 DIAGNOSIS — E119 Type 2 diabetes mellitus without complications: Secondary | ICD-10-CM | POA: Diagnosis not present

## 2024-03-11 DIAGNOSIS — E785 Hyperlipidemia, unspecified: Secondary | ICD-10-CM

## 2024-03-11 LAB — POCT GLYCOSYLATED HEMOGLOBIN (HGB A1C): Hemoglobin A1C: 6.9 % — AB (ref 4.0–5.6)

## 2024-03-11 MED ORDER — VITAMIN D (ERGOCALCIFEROL) 1.25 MG (50000 UNIT) PO CAPS: 50000.0000 [IU] | ORAL_CAPSULE | ORAL | 3 refills | Status: DC

## 2024-03-11 MED ORDER — ATORVASTATIN CALCIUM 20 MG PO TABS
20.0000 mg | ORAL_TABLET | Freq: Every day | ORAL | 3 refills | Status: AC
Start: 2024-03-11 — End: ?

## 2024-03-21 ENCOUNTER — Other Ambulatory Visit: Payer: Self-pay | Admitting: Obstetrics and Gynecology

## 2024-03-21 DIAGNOSIS — R928 Other abnormal and inconclusive findings on diagnostic imaging of breast: Secondary | ICD-10-CM

## 2024-03-22 ENCOUNTER — Ambulatory Visit
Admission: RE | Admit: 2024-03-22 | Discharge: 2024-03-22 | Disposition: A | Discharge status: Home / Self Care | Source: Ambulatory Visit | Attending: Obstetrics and Gynecology | Admitting: Obstetrics and Gynecology

## 2024-03-22 DIAGNOSIS — R928 Other abnormal and inconclusive findings on diagnostic imaging of breast: Secondary | ICD-10-CM

## 2024-03-30 ENCOUNTER — Encounter

## 2024-03-30 ENCOUNTER — Other Ambulatory Visit

## 2024-09-12 ENCOUNTER — Ambulatory Visit: Admitting: Internal Medicine

## 2024-11-18 ENCOUNTER — Telehealth: Admitting: Internal Medicine

## 2024-11-18 ENCOUNTER — Encounter: Payer: Self-pay | Admitting: Internal Medicine

## 2024-11-18 ENCOUNTER — Telehealth: Payer: Self-pay | Admitting: Internal Medicine

## 2024-11-18 VITALS — Ht 61.0 in | Wt 212.0 lb

## 2024-11-18 DIAGNOSIS — E559 Vitamin D deficiency, unspecified: Secondary | ICD-10-CM | POA: Diagnosis not present

## 2024-11-18 DIAGNOSIS — E119 Type 2 diabetes mellitus without complications: Secondary | ICD-10-CM | POA: Diagnosis not present

## 2024-11-18 DIAGNOSIS — E785 Hyperlipidemia, unspecified: Secondary | ICD-10-CM | POA: Diagnosis not present

## 2024-11-18 MED ORDER — VITAMIN D (ERGOCALCIFEROL) 1.25 MG (50000 UNIT) PO CAPS: 50000.0000 [IU] | ORAL_CAPSULE | ORAL | 3 refills | Status: AC

## 2024-11-18 NOTE — Telephone Encounter (Signed)
 Please schedule patient for follow-up in 4 months

## 2025-03-21 ENCOUNTER — Ambulatory Visit: Admitting: Internal Medicine
